# Patient Record
Sex: Female | Born: 1990 | Race: White | Hispanic: No | Marital: Single | Smoking: Former smoker
Health system: Southern US, Community
[De-identification: ages and names within clinical notes are randomized; demographics above are authoritative.]

## PROBLEM LIST (undated history)

## (undated) DIAGNOSIS — K8020 Calculus of gallbladder without cholecystitis without obstruction: Secondary | ICD-10-CM

## (undated) DIAGNOSIS — O24419 Gestational diabetes mellitus in pregnancy, unspecified control: Secondary | ICD-10-CM

## (undated) HISTORY — DX: Gestational diabetes mellitus in pregnancy, unspecified control: O24.419

## (undated) HISTORY — PX: OTHER SURGICAL HISTORY: SHX169

---
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
## 2008-01-14 ENCOUNTER — Emergency Department: Payer: Self-pay | Admitting: Emergency Medicine

## 2008-06-02 ENCOUNTER — Emergency Department: Payer: Self-pay | Admitting: Emergency Medicine

## 2009-05-15 ENCOUNTER — Emergency Department: Payer: Self-pay | Admitting: Emergency Medicine

## 2011-12-03 ENCOUNTER — Emergency Department: Payer: Self-pay | Admitting: Emergency Medicine

## 2013-04-04 ENCOUNTER — Emergency Department: Payer: Self-pay | Admitting: Emergency Medicine

## 2013-04-19 ENCOUNTER — Emergency Department: Payer: Self-pay | Admitting: Emergency Medicine

## 2013-04-19 LAB — URINALYSIS, COMPLETE
Nitrite: NEGATIVE
Ph: 5 (ref 4.5–8.0)
RBC,UR: 606 /HPF (ref 0–5)
Specific Gravity: 1.032 (ref 1.003–1.030)
WBC UR: 4 /HPF (ref 0–5)

## 2013-04-20 LAB — CBC
HGB: 14.5 g/dL (ref 12.0–16.0)
MCHC: 34.2 g/dL (ref 32.0–36.0)
RBC: 4.9 10*6/uL (ref 3.80–5.20)
RDW: 14.5 % (ref 11.5–14.5)

## 2013-05-02 ENCOUNTER — Emergency Department: Payer: Self-pay | Admitting: Internal Medicine

## 2013-05-02 LAB — URINALYSIS, COMPLETE
Bacteria: NONE SEEN
Bilirubin,UR: NEGATIVE
Leukocyte Esterase: NEGATIVE
Specific Gravity: 1.023 (ref 1.003–1.030)
WBC UR: 3 /HPF (ref 0–5)

## 2013-05-02 LAB — HCG, QUANTITATIVE, PREGNANCY: Beta Hcg, Quant.: 250 m[IU]/mL — ABNORMAL HIGH

## 2013-05-02 LAB — CBC
MCHC: 33.3 g/dL (ref 32.0–36.0)
MCV: 87 fL (ref 80–100)
Platelet: 238 10*3/uL (ref 150–440)
WBC: 9.8 10*3/uL (ref 3.6–11.0)

## 2013-05-14 ENCOUNTER — Ambulatory Visit: Payer: Self-pay | Admitting: Physician Assistant

## 2013-12-25 ENCOUNTER — Emergency Department: Payer: Self-pay | Admitting: Emergency Medicine

## 2013-12-26 LAB — BETA STREP CULTURE(ARMC)

## 2014-04-27 ENCOUNTER — Emergency Department: Payer: Self-pay | Admitting: Emergency Medicine

## 2014-04-27 LAB — COMPREHENSIVE METABOLIC PANEL
AST: 16 U/L (ref 15–37)
Albumin: 3.6 g/dL (ref 3.4–5.0)
Alkaline Phosphatase: 73 U/L
Anion Gap: 9 (ref 7–16)
BILIRUBIN TOTAL: 0.6 mg/dL (ref 0.2–1.0)
BUN: 6 mg/dL — ABNORMAL LOW (ref 7–18)
CREATININE: 0.65 mg/dL (ref 0.60–1.30)
Calcium, Total: 8.7 mg/dL (ref 8.5–10.1)
Chloride: 103 mmol/L (ref 98–107)
Co2: 24 mmol/L (ref 21–32)
EGFR (African American): >60
EGFR (Non-African Amer.): >60
Glucose: 92 mg/dL (ref 65–99)
OSMOLALITY: 269 (ref 275–301)
Potassium: 3.9 mmol/L (ref 3.5–5.1)
SGPT (ALT): 32 U/L (ref 12–78)
Sodium: 136 mmol/L (ref 136–145)
TOTAL PROTEIN: 7.1 g/dL (ref 6.4–8.2)

## 2014-04-27 LAB — CBC WITH DIFFERENTIAL/PLATELET
Basophil #: 0 10*3/uL (ref 0.0–0.1)
Basophil %: 0.2 %
EOS PCT: 0.3 %
Eosinophil #: 0 10*3/uL (ref 0.0–0.7)
HCT: 43.3 % (ref 35.0–47.0)
HGB: 14.6 g/dL (ref 12.0–16.0)
LYMPHS ABS: 1.5 10*3/uL (ref 1.0–3.6)
Lymphocyte %: 16.5 %
MCH: 30.4 pg (ref 26.0–34.0)
MCHC: 33.7 g/dL (ref 32.0–36.0)
MCV: 90 fL (ref 80–100)
Monocyte #: 0.5 x10 3/mm (ref 0.2–0.9)
Monocyte %: 5.3 %
Neutrophil #: 7 10*3/uL — ABNORMAL HIGH (ref 1.4–6.5)
Neutrophil %: 77.7 %
PLATELETS: 212 10*3/uL (ref 150–440)
RBC: 4.79 10*6/uL (ref 3.80–5.20)
RDW: 12.9 % (ref 11.5–14.5)
WBC: 9 10*3/uL (ref 3.6–11.0)

## 2014-04-27 LAB — URINALYSIS, COMPLETE
BILIRUBIN, UR: NEGATIVE
Bacteria: NONE SEEN
Blood: NEGATIVE
Glucose,UR: NEGATIVE mg/dL (ref 0–75)
LEUKOCYTE ESTERASE: NEGATIVE
Nitrite: NEGATIVE
PH: 7 (ref 4.5–8.0)
PROTEIN: NEGATIVE
RBC,UR: 1 /HPF (ref 0–5)
SPECIFIC GRAVITY: 1.02 (ref 1.003–1.030)
Squamous Epithelial: 12

## 2014-05-19 ENCOUNTER — Encounter: Payer: Self-pay | Admitting: Obstetrics & Gynecology

## 2014-06-06 ENCOUNTER — Encounter: Payer: Self-pay | Admitting: Obstetrics & Gynecology

## 2014-07-14 ENCOUNTER — Encounter: Payer: Self-pay | Admitting: Obstetrics & Gynecology

## 2014-07-21 ENCOUNTER — Emergency Department: Payer: Self-pay | Admitting: Emergency Medicine

## 2014-07-22 LAB — CBC WITH DIFFERENTIAL/PLATELET
BASOS ABS: 0 10*3/uL (ref 0.0–0.1)
BASOS PCT: 0.4 %
EOS ABS: 0.1 10*3/uL (ref 0.0–0.7)
EOS PCT: 1 %
HCT: 36.1 % (ref 35.0–47.0)
HGB: 11.8 g/dL — ABNORMAL LOW (ref 12.0–16.0)
LYMPHS PCT: 19.2 %
Lymphocyte #: 2.1 10*3/uL (ref 1.0–3.6)
MCH: 29 pg (ref 26.0–34.0)
MCHC: 32.6 g/dL (ref 32.0–36.0)
MCV: 89 fL (ref 80–100)
Monocyte #: 0.7 x10 3/mm (ref 0.2–0.9)
Monocyte %: 6 %
NEUTROS ABS: 8.1 10*3/uL — AB (ref 1.4–6.5)
Neutrophil %: 73.4 %
PLATELETS: 188 10*3/uL (ref 150–440)
RBC: 4.07 10*6/uL (ref 3.80–5.20)
RDW: 12.8 % (ref 11.5–14.5)
WBC: 11 10*3/uL (ref 3.6–11.0)

## 2014-07-22 LAB — COMPREHENSIVE METABOLIC PANEL
ALT: 35 U/L
Albumin: 2.8 g/dL — ABNORMAL LOW (ref 3.4–5.0)
Alkaline Phosphatase: 66 U/L
Anion Gap: 7 (ref 7–16)
BILIRUBIN TOTAL: 0.3 mg/dL (ref 0.2–1.0)
BUN: 11 mg/dL (ref 7–18)
CALCIUM: 8.3 mg/dL — AB (ref 8.5–10.1)
Chloride: 106 mmol/L (ref 98–107)
Co2: 23 mmol/L (ref 21–32)
Creatinine: 0.54 mg/dL — ABNORMAL LOW (ref 0.60–1.30)
EGFR (African American): >60
EGFR (Non-African Amer.): >60
Glucose: 102 mg/dL — ABNORMAL HIGH (ref 65–99)
Osmolality: 272 (ref 275–301)
Potassium: 3.9 mmol/L (ref 3.5–5.1)
SGOT(AST): 28 U/L (ref 15–37)
Sodium: 136 mmol/L (ref 136–145)
Total Protein: 6.4 g/dL (ref 6.4–8.2)

## 2014-08-22 ENCOUNTER — Encounter: Payer: Self-pay | Admitting: Obstetrics & Gynecology

## 2014-10-10 ENCOUNTER — Encounter: Payer: Self-pay | Admitting: Maternal & Fetal Medicine

## 2014-11-14 ENCOUNTER — Encounter: Payer: Self-pay | Admitting: Obstetrics and Gynecology

## 2014-11-21 ENCOUNTER — Encounter: Payer: Self-pay | Admitting: Obstetrics and Gynecology

## 2014-11-28 ENCOUNTER — Observation Stay: Payer: Self-pay

## 2014-12-01 ENCOUNTER — Encounter: Payer: Self-pay | Admitting: Maternal & Fetal Medicine

## 2014-12-06 DIAGNOSIS — O24419 Gestational diabetes mellitus in pregnancy, unspecified control: Secondary | ICD-10-CM | POA: Insufficient documentation

## 2014-12-12 DIAGNOSIS — A568 Sexually transmitted chlamydial infection of other sites: Secondary | ICD-10-CM | POA: Insufficient documentation

## 2014-12-12 DIAGNOSIS — B009 Herpesviral infection, unspecified: Secondary | ICD-10-CM | POA: Insufficient documentation

## 2014-12-12 DIAGNOSIS — O99330 Smoking (tobacco) complicating pregnancy, unspecified trimester: Secondary | ICD-10-CM | POA: Insufficient documentation

## 2014-12-12 DIAGNOSIS — O98519 Other viral diseases complicating pregnancy, unspecified trimester: Secondary | ICD-10-CM

## 2014-12-12 DIAGNOSIS — O98319 Other infections with a predominantly sexual mode of transmission complicating pregnancy, unspecified trimester: Secondary | ICD-10-CM

## 2014-12-12 DIAGNOSIS — O0990 Supervision of high risk pregnancy, unspecified, unspecified trimester: Secondary | ICD-10-CM | POA: Insufficient documentation

## 2015-02-28 NOTE — H&P (Signed)
L&D Evaluation:  History:  HPI 24 year old g2 P0010 with EDC=12/18/2014 by a 9wk4d ultrasound presents with N/V/D x 2 days. Last kept down food 2/6 PM. Was able to keep down fluids yesterday. Has vomited 6x today and once there was blood in vomitus. Has had diarrhea 4x today, but no blood in stool. Has not had antibiotics in the last 6 mos. Has had some lower abdominal cramping since last week and the cramping and uterine tightening has worsened today. PNC begun at [facility], but she received no PNC from 11-35 weeks except for an [facility] at 17wk4 days. Her PNC has also been remarkable for untreated GDM diagnosed at 36 weeks, hx of HSV (started Acyclovir ppx recently), Obesity (BMI44 at start of pregnancy), and positive GBS culture. LABS: O POS/RI (vaccine x2), VI   Presents with nausea/vomiting, diarrhea and cramping   Patient's Medical History Obesity, GDM (untreated), HSV   Medications Pre Natal Vitamins  Acyclovir 400 mgm BID, TUMS   Allergies NKDA   Social History tobacco   Family History Non-Contributory   ROS:  ROS see HPI   Exam:  Vital Signs 125/93, 131/86. 98.7 Pulse 101-127    General no apparent distress   Mental Status clear   Chest clear   Heart normal sinus rhythm, no murmur/gallop/rubs   Abdomen gravid/ tender LUS/ BS active   Fetal Position cephalic   Back no CVAT   Edema 1+   Pelvic no external lesions, 3/80%/-2   Mebranes Intact   FHT normal rate with no decels, 145-150 with accels to 170s-180s   FHT Description mod variability   Ucx q1-3 min apart   Impression:  Impression IUP at 37 1/7 weeks with N/V/D. R/O labor   Plan:  Plan EFM/NST, monitor contractions and for cervical change, IV hydration and IV Zofran. UA. UDS. CBC, BMP.  NPO until 9PM. Then clear liquids if no further vomiting.   Electronic Signatures: Trinna BalloonGutierrez, Cynai Skeens L (CNM)  (Signed 08-Feb-16 23:58)  Authored: L&D Evaluation   Last Updated: 08-Feb-16 23:58 by  Trinna BalloonGutierrez, Betzaira Mentel L (CNM)

## 2015-06-07 IMAGING — US US OB < 14 WEEKS - US OB TV
1 series · 13 of 28 positions shown · non-contrast
Comparison: none

REASON FOR EXAM: viability dating
COMMENTS:

[Series 1: us ob < 14 weeks - us ob tv · 0.26mm/px · 87 acquisitions, 13 frames shown]
[im 4/87]
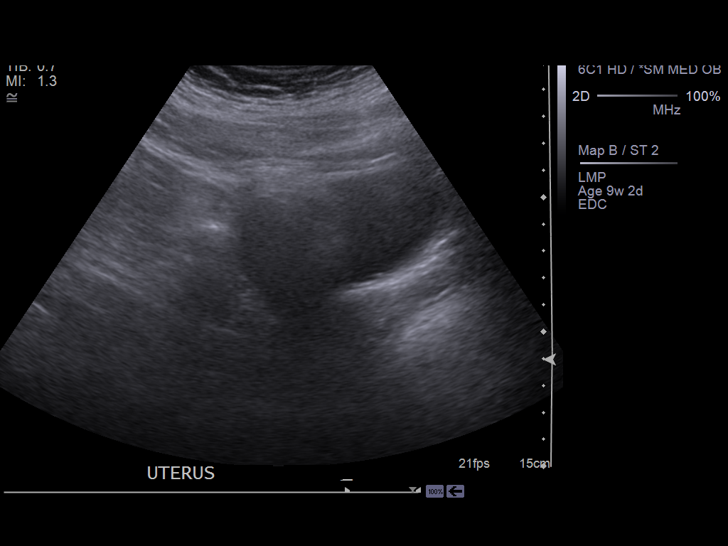
[im 10/87]
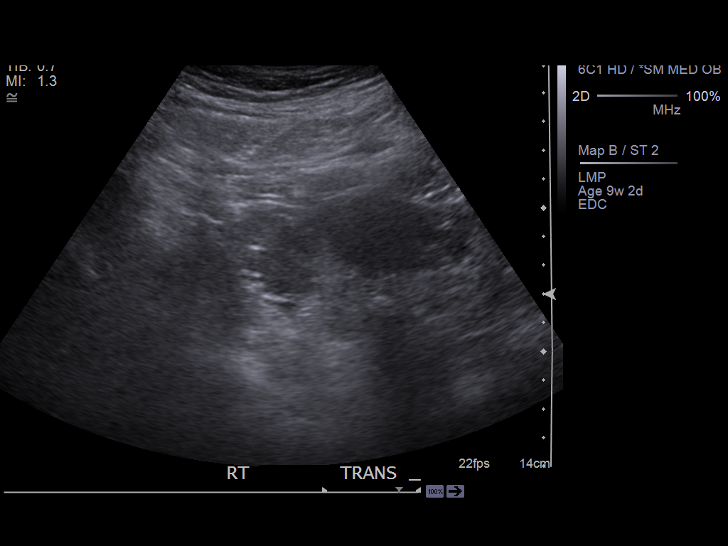
[im 16/87]
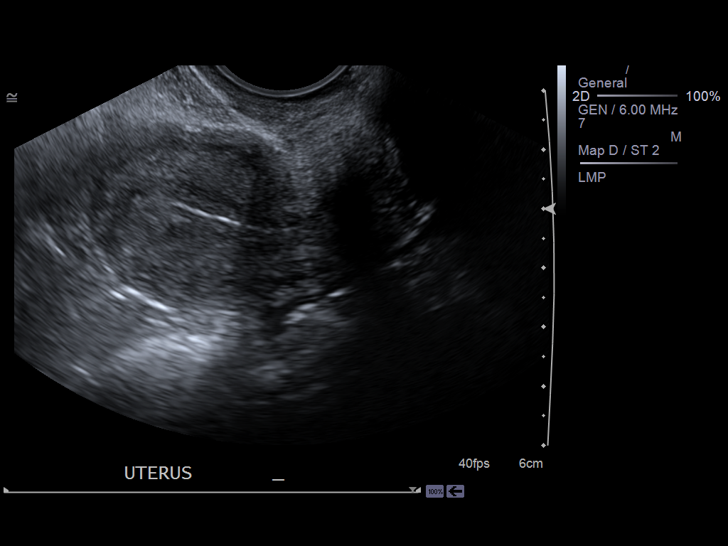
[im 23/87]
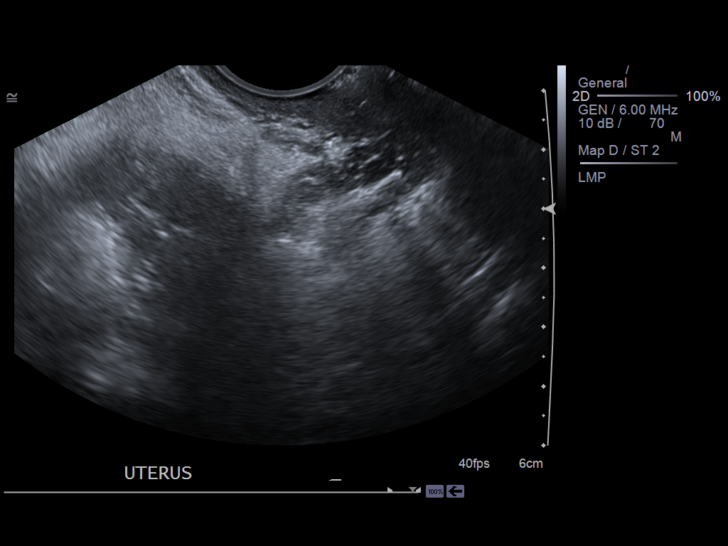
[im 29/87]
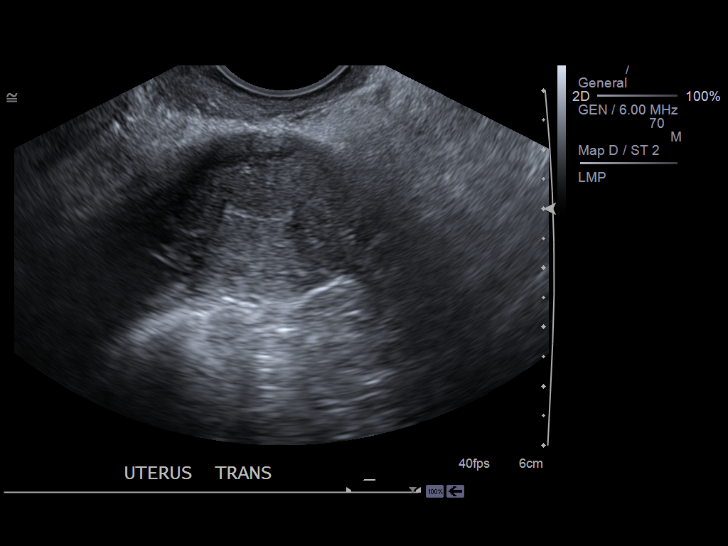
[im 36/87]
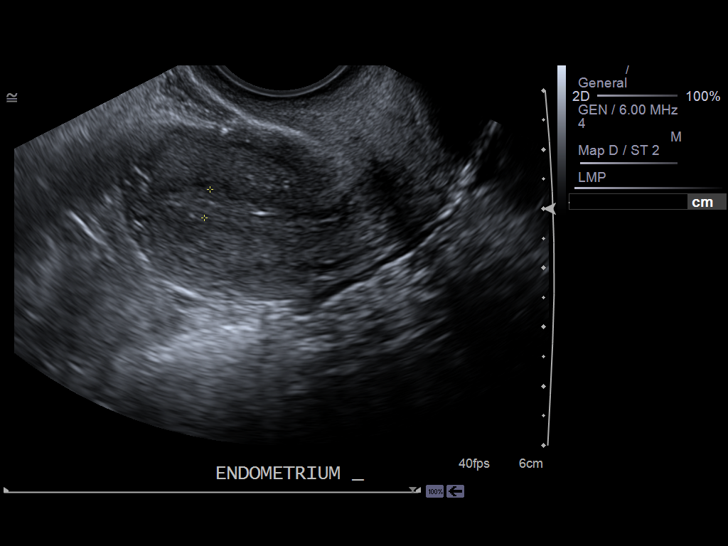
[im 45/87]
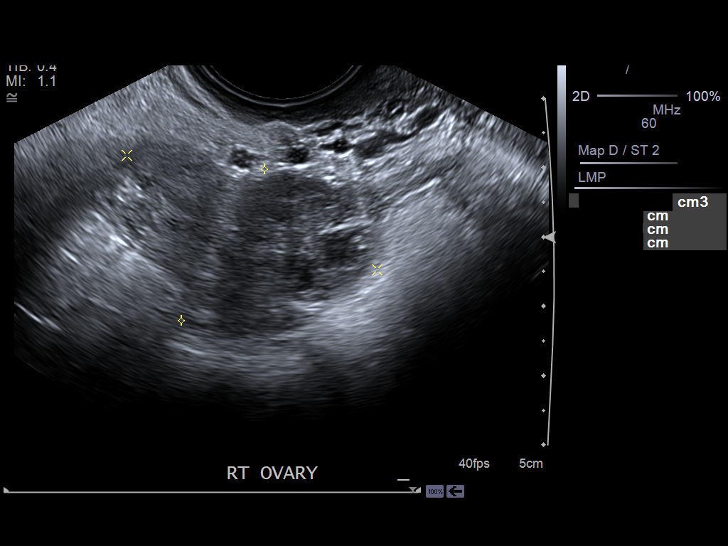
[im 51/87]
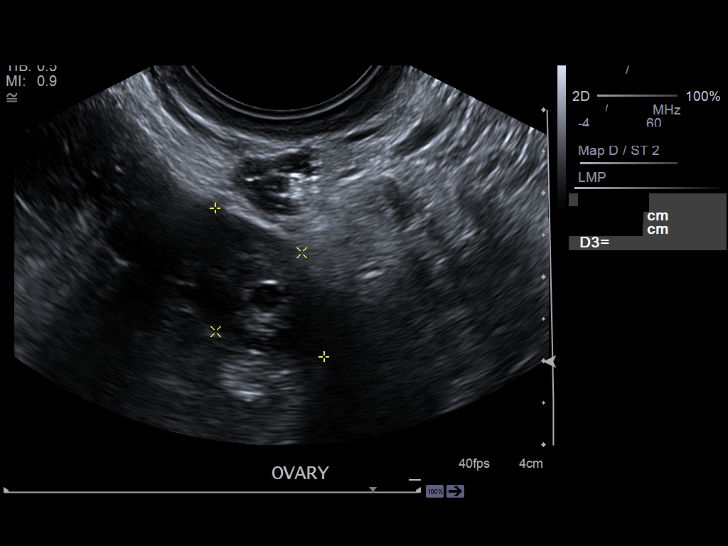
[im 58/87]
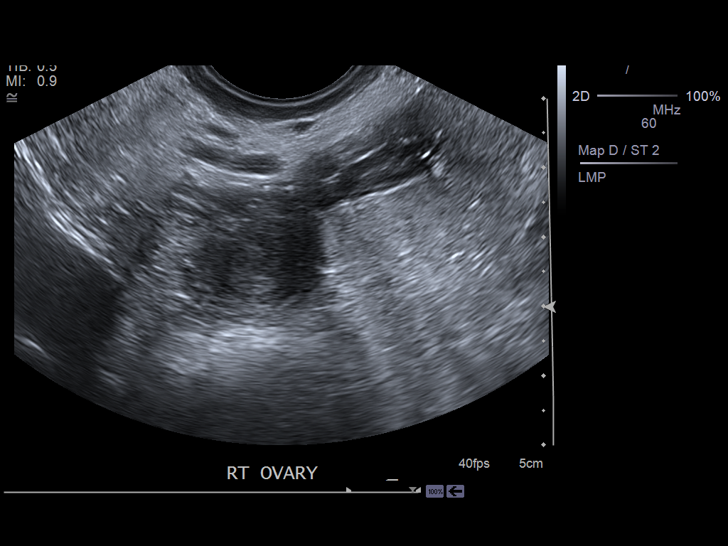
[im 64/87]
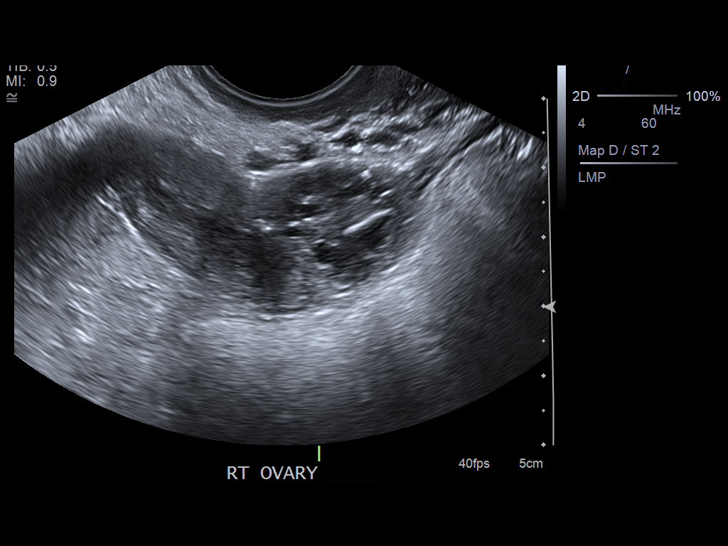
[im 71/87]
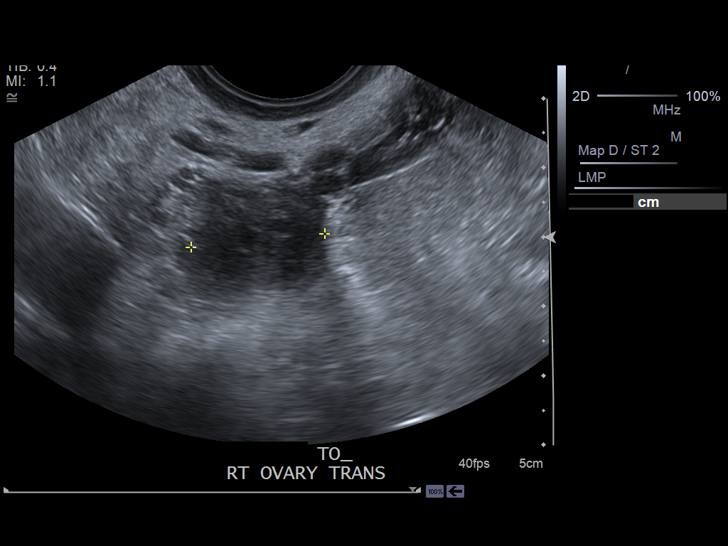
[im 77/87]
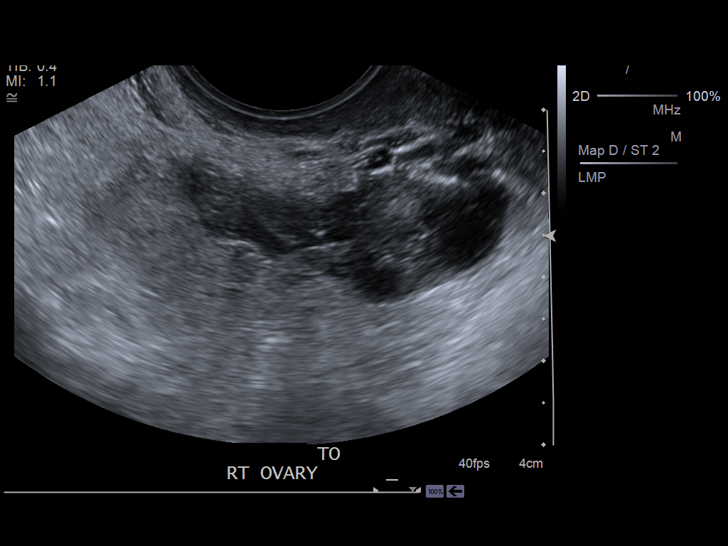
[im 83/87]
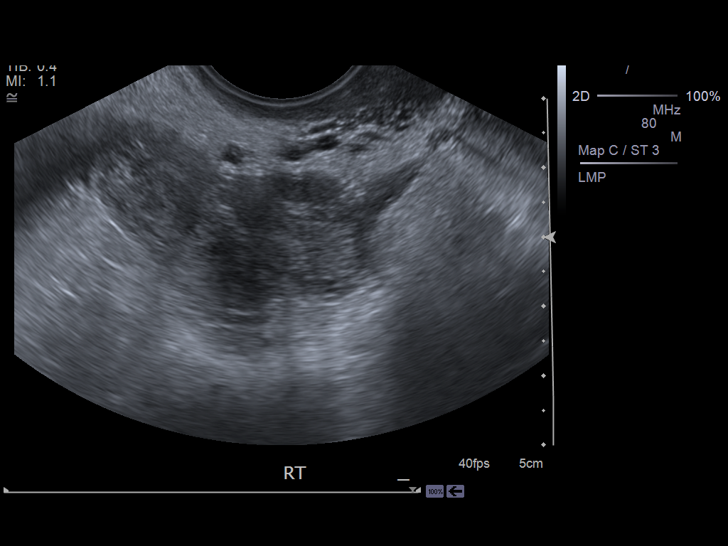

[13 of 28 positions shown; findings below may reference images not displayed]

PROCEDURE:     ADARSH - ADARSH OB LESS THAN 14 WEEKS W/TV  - May 14, 2013 [DATE]

RESULT:     Transabdominal and transvaginal imaging techniques were employed
to evaluate the pelvis. The patient's current beta-hCG values are not
available but there has been a downward trend over the preceding month.

The uterus appears empty. It demonstrates a smooth contour and measures 6 x
3 x 4 cm. The endometrial stripe measures 5 mm in thickness. No endometrial
fluid collections are demonstrated.

The left ovary measures 2.2 x 1.4 x 1.4 centimeters. It demonstrates a
developing a follicle. Its vascularity is normal.

The right ovary measures 4 x 2.5 x 1.8 cm. It demonstrates numerous
developing follicles and normal vascularity. Immediately adjacent to or
arising from the right ovary there is a 2.3 x 1.7 x 1.9 cm diameter
uniformly hypoechoic mass demonstrating some distal shadowing. There is a
trace of free fluid in the adnexal regions.
IMPRESSION: 1. The uterus is empty with no endometrial stripe thickening.
2. The left ovary is normal in appearance.
3. The right ovary itself appears normal but arising from at or immediately
adjacent to the right ovary there is a hypoechoic nonhypervascular mass that
is of uncertain significance. This could reflect a hemorrhagic cyst, a
nonviable ectopic, an endometrioma, or other entities such as tubo-ovarian
abscess in the appropriate clinical setting.

Correlation with the patient's clinical and laboratory values including a
current beta-hCG is needed. Followup pelvic CT scanning may be useful.

[REDACTED]

## 2015-09-22 ENCOUNTER — Encounter: Payer: Self-pay | Admitting: Emergency Medicine

## 2015-09-22 ENCOUNTER — Emergency Department
Admission: EM | Admit: 2015-09-22 | Discharge: 2015-09-23 | Disposition: A | Discharge status: Home / Self Care | Payer: Medicaid Other | Attending: Emergency Medicine | Admitting: Emergency Medicine

## 2015-09-22 DIAGNOSIS — O99211 Obesity complicating pregnancy, first trimester: Secondary | ICD-10-CM | POA: Insufficient documentation

## 2015-09-22 DIAGNOSIS — Z3A01 Less than 8 weeks gestation of pregnancy: Secondary | ICD-10-CM | POA: Diagnosis not present

## 2015-09-22 DIAGNOSIS — F17200 Nicotine dependence, unspecified, uncomplicated: Secondary | ICD-10-CM | POA: Insufficient documentation

## 2015-09-22 DIAGNOSIS — O99331 Smoking (tobacco) complicating pregnancy, first trimester: Secondary | ICD-10-CM | POA: Insufficient documentation

## 2015-09-22 DIAGNOSIS — O9989 Other specified diseases and conditions complicating pregnancy, childbirth and the puerperium: Secondary | ICD-10-CM | POA: Diagnosis present

## 2015-09-22 DIAGNOSIS — R110 Nausea: Secondary | ICD-10-CM | POA: Diagnosis not present

## 2015-09-22 DIAGNOSIS — R1011 Right upper quadrant pain: Secondary | ICD-10-CM | POA: Insufficient documentation

## 2015-09-22 DIAGNOSIS — R109 Unspecified abdominal pain: Secondary | ICD-10-CM

## 2015-09-22 DIAGNOSIS — O26851 Spotting complicating pregnancy, first trimester: Secondary | ICD-10-CM | POA: Diagnosis not present

## 2015-09-22 LAB — CBC
HCT: 39.2 % (ref 35.0–47.0)
HEMOGLOBIN: 13 g/dL (ref 12.0–16.0)
MCH: 27.5 pg (ref 26.0–34.0)
MCHC: 33.1 g/dL (ref 32.0–36.0)
MCV: 83 fL (ref 80.0–100.0)
PLATELETS: 247 10*3/uL (ref 150–440)
RBC: 4.73 MIL/uL (ref 3.80–5.20)
RDW: 14 % (ref 11.5–14.5)
WBC: 10.6 10*3/uL (ref 3.6–11.0)

## 2015-09-22 LAB — COMPREHENSIVE METABOLIC PANEL
ALBUMIN: 3.8 g/dL (ref 3.5–5.0)
ALK PHOS: 91 U/L (ref 38–126)
ALT: 20 U/L (ref 14–54)
ANION GAP: 6 (ref 5–15)
AST: 20 U/L (ref 15–41)
BILIRUBIN TOTAL: 0.1 mg/dL — AB (ref 0.3–1.2)
BUN: 14 mg/dL (ref 6–20)
CALCIUM: 9.1 mg/dL (ref 8.9–10.3)
CO2: 26 mmol/L (ref 22–32)
CREATININE: 0.7 mg/dL (ref 0.44–1.00)
Chloride: 106 mmol/L (ref 101–111)
GFR calc non Af Amer: >60 mL/min (ref >60)
GLUCOSE: 122 mg/dL — AB (ref 65–99)
Potassium: 3.9 mmol/L (ref 3.5–5.1)
SODIUM: 138 mmol/L (ref 135–145)
TOTAL PROTEIN: 7.3 g/dL (ref 6.5–8.1)

## 2015-09-22 LAB — URINALYSIS COMPLETE WITH MICROSCOPIC (ARMC ONLY)
BILIRUBIN URINE: NEGATIVE
Bacteria, UA: NONE SEEN
GLUCOSE, UA: NEGATIVE mg/dL
Leukocytes, UA: NEGATIVE
NITRITE: NEGATIVE
PROTEIN: 30 mg/dL — AB
SPECIFIC GRAVITY, URINE: 1.027 (ref 1.005–1.030)
pH: 6 (ref 5.0–8.0)

## 2015-09-22 LAB — WET PREP, GENITAL
CLUE CELLS WET PREP: NONE SEEN
SPERM: NONE SEEN
TRICH WET PREP: NONE SEEN
YEAST WET PREP: NONE SEEN

## 2015-09-22 LAB — POCT PREGNANCY, URINE: Preg Test, Ur: POSITIVE — AB

## 2015-09-22 LAB — ABO/RH: ABO/RH(D): O POS

## 2015-09-22 LAB — LIPASE, BLOOD: Lipase: 26 U/L (ref 11–51)

## 2015-09-22 LAB — HCG, QUANTITATIVE, PREGNANCY: hCG, Beta Chain, Quant, S: 883 m[IU]/mL — ABNORMAL HIGH (ref <5)

## 2015-09-22 MED ORDER — ONDANSETRON HCL 4 MG/2ML IJ SOLN
4.0000 mg | Freq: Once | INTRAMUSCULAR | Status: AC
  Administered 2015-09-22: 4 mg via INTRAVENOUS
  Filled 2015-09-22: qty 2

## 2015-09-22 MED ORDER — OXYCODONE-ACETAMINOPHEN 5-325 MG PO TABS
1.0000 | ORAL_TABLET | Freq: Once | ORAL | Status: AC
  Administered 2015-09-22: 1 via ORAL
  Filled 2015-09-22: qty 1

## 2015-09-22 MED ORDER — FENTANYL CITRATE (PF) 100 MCG/2ML IJ SOLN
50.0000 ug | Freq: Once | INTRAMUSCULAR | Status: AC
  Administered 2015-09-22: 50 ug via INTRAVENOUS
  Filled 2015-09-22: qty 2

## 2015-09-22 NOTE — ED Notes (Signed)
pt reports sharp RUQ pain that is intermittant but states today is worst ever

## 2015-09-22 NOTE — ED Provider Notes (Signed)
[facility] Emergency Department Provider Note  ____________________________________________  Time seen: Approximately 9:30 PM  I have reviewed the triage vital signs and the nursing notes.   HISTORY  Chief Complaint Abdominal Pain    HPI Crystal Strickland is a 24 y.o. female G3 P1 A1 who just found out she is pregnant with no reliable LMP, history of morbid obesity, presenting with right upper quadrant pain. Patient reports that during her last pregnancy she had some severe right upper quadrant pain that was never worked up. Over the past day she has been having a severe sharp right upper quadrant pain that radiates to the back associated with mild nausea but no vomiting. No fevers or chills. On arrival here, the patient had a pregnancy test is positive and states that she is currently "spotting." She states that she never has normal menstrual cycles. She denies any change in vaginal discharge, dysuria, diarrhea.   History reviewed. No pertinent past medical history.  There are no active problems to display for this patient.   History reviewed. No pertinent past surgical history.  No current outpatient prescriptions on file.  Allergies Review of patient's allergies indicates no known allergies.  History reviewed. No pertinent family history.  Social History Social History  Substance Use Topics  . Smoking status: Current Every Day Smoker  . Smokeless tobacco: None  . Alcohol Use: No    Review of Systems Constitutional: No fever/chills Eyes: No visual changes. ENT: No sore throat. Cardiovascular: Denies chest pain, palpitations. Respiratory: Denies shortness of breath.  No cough. Gastrointestinal: Positive right upper quadrant abdominal pain.  No nausea, no vomiting.  No diarrhea.  No constipation. Genitourinary: Negative for dysuria. No change in vaginal discharge. Positive vaginal spotting. Musculoskeletal: Negative for back pain. Skin:  Negative for rash. Neurological: Negative for headaches, focal weakness or numbness.  10-point ROS otherwise negative.  ____________________________________________   PHYSICAL EXAM:  VITAL SIGNS: ED Triage Vitals  Enc Vitals Group     BP 09/22/15 2028 124/85 mmHg     Pulse Rate 09/22/15 2028 80     Resp 09/22/15 2028 20     Temp 09/22/15 2028 97.7 F (36.5 C)     Temp Source 09/22/15 2028 Oral     SpO2 09/22/15 2028 99 %     Weight 09/22/15 2028 300 lb (136.079 kg)     Height 09/22/15 2028 5\' 8"  (1.727 m)     Head Cir --      Peak Flow --      Pain Score 09/22/15 2028 10     Pain Loc --      Pain Edu? --      Excl. in GC? --     Constitutional: Alert and oriented. Well appearing and in no acute distress. Answer question appropriately. Eyes: Conjunctivae are normal.  EOMI. Head: Atraumatic. Nose: No congestion/rhinnorhea. Mouth/Throat: Mucous membranes are moist.  Neck: No stridor.  Supple.   Cardiovascular: Normal rate, regular rhythm. No murmurs, rubs or gallops.  Respiratory: Normal respiratory effort.  No retractions. Lungs CTAB.  No wheezes, rales or ronchi. Gastrointestinal: Abdomen is obese, soft, nondistended. Patient has mild right upper quadrant tenderness to palpation without a Murphy's sign. No guarding or rebound. No peritoneal signs. Genitourinary: Normal-appearing external genitalia without lesions. Speculum exam reveals a mild amount of dark blood, no significant vaginal discharge, normal-appearing cervix. Bimanual exam reveals open external all, no CMT, no adnexal asses or tenderness, no suprapubic mass or tenderness. Musculoskeletal: No LE  edema.  Neurologic:  Normal speech and language. No gross focal neurologic deficits are appreciated.  Skin:  Skin is warm, dry and intact. No rash noted. Psychiatric: Mood and affect are normal. Speech and behavior are normal.  Normal judgement.  ____________________________________________   LABS (all labs ordered  are listed, but only abnormal results are displayed)  Labs Reviewed  COMPREHENSIVE METABOLIC PANEL - Abnormal; Notable for the following:    Glucose, Bld 122 (*)    Total Bilirubin 0.1 (*)    All other components within normal limits  URINALYSIS COMPLETEWITH MICROSCOPIC ([facility] ONLY) - Abnormal; Notable for the following:    Color, Urine YELLOW (*)    APPearance HAZY (*)    Ketones, ur TRACE (*)    Hgb urine dipstick 3+ (*)    Protein, ur 30 (*)    Squamous Epithelial / LPF 6-30 (*)    All other components within normal limits  POCT PREGNANCY, URINE - Abnormal; Notable for the following:    Preg Test, Ur POSITIVE (*)    All other components within normal limits  WET PREP, GENITAL  CHLAMYDIA/NGC RT PCR ([facility] ONLY)  LIPASE, BLOOD  CBC  HCG, QUANTITATIVE, PREGNANCY  POC URINE PREG, ED  ABO/RH   ____________________________________________  EKG  Not indicated ____________________________________________  RADIOLOGY  No results found.  ____________________________________________   PROCEDURES  Procedure(s) performed: None  Critical Care performed: No ____________________________________________   INITIAL IMPRESSION / ASSESSMENT AND PLAN / ED COURSE  Pertinent labs & imaging results that were available during my care of the patient were reviewed by me and considered in my medical decision making (see chart for details).  24 y.o. female G3 P1 A1 with new pregnancy diagnosis presenting with right upper quadrant pain. Given that the patient is having vaginal spotting and does not how far along she is in the pregnancy, I will evaluate her for her pregnancy and rule out ectopic pregnancy. Her right upper quadrant pain is possibly due to gallbladder disease and we will get an ultrasound to evaluate her for that as well.  ----------------------------------------- 10:05 PM on 09/22/2015 -----------------------------------------  The patient's right upper quadrant  pain has completely resolved. Her pelvic exam has been performed and I'm waiting for her right upper quadrant and pelvic ultrasounds. Her labs are still pending.  ----------------------------------------- 10:55 PM on 09/22/2015 -----------------------------------------  I sign the patient out to Dr. Huel Cote who will follow her Rh type, the remainder of her labs, and her right upper quadrant and pelvic ultrasounds. The patient is in stable condition and her pain is significantly improved. ____________________________________________  FINAL CLINICAL IMPRESSION(S) / ED DIAGNOSES  Final diagnoses:  Right upper quadrant pain      NEW MEDICATIONS STARTED DURING THIS VISIT:  New Prescriptions   No medications on file     Rockne Menghini, MD 09/22/15 2255

## 2015-09-23 ENCOUNTER — Emergency Department: Payer: Medicaid Other

## 2015-09-23 LAB — CHLAMYDIA/NGC RT PCR (ARMC ONLY)
CHLAMYDIA TR: NOT DETECTED
N gonorrhoeae: NOT DETECTED

## 2015-09-23 NOTE — ED Provider Notes (Signed)
-----------------------------------------   2:29 AM on 09/23/2015 -----------------------------------------   Blood pressure 124/86, pulse 82, temperature 97.7 F (36.5 C), temperature source Oral, resp. rate 19, height 5\' 8"  (1.727 m), weight 300 lb (136.079 kg), last menstrual period 08/23/2015, SpO2 99 %.  Assuming care from Dr. Ezzard StandingNewman.  In short, Crystal Strickland is a 24 y.o. female with a chief complaint of Abdominal Pain .  Refer to the original H&P for additional details.   EXAM: US ABDOMEN LIMITED - RIGHT UPPER QUADRANT  COMPARISON: None.  FINDINGS: Gallbladder:  The gallbladder is contracted around multiple calculi. No gallbladder wall thickening or pericholecystic fluid. The patient was not tender over the gallbladder.  Common bile duct:  Diameter: 4.9 mm.  Liver:  No focal lesion identified. Within normal limits in parenchymal echogenicity.  IMPRESSION: Cholelithiasis without sonographic evidence of cholecystitis. Unremarkable liver and bile ducts. Incidentally noted upper pole right renal cyst measuring 3 cm.   Electronically Signed By: Ellery Plunkaniel R Mitchell M.D. On: 09/23/2015 02:05          US OB Comp Less 14 Wks (Final result) Result time: 09/23/15 01:36:51   Final result by Rad Results In Interface (09/23/15 01:36:51)   Narrative:   CLINICAL DATA: Lower abdominal pain and spotting.  EXAM: OBSTETRIC <14 WK US AND TRANSVAGINAL OB US  TECHNIQUE: Both transabdominal and transvaginal ultrasound examinations were performed for complete evaluation of the gestation as well as the maternal uterus, adnexal regions, and pelvic cul-de-sac. Transvaginal technique was performed to assess early pregnancy.  COMPARISON: None.  FINDINGS: Intrauterine gestational sac: None  Yolk sac: No  Embryo: No  Cardiac Activity: No  Heart Rate:  bpm  MSD:  mm  w   d  CRL:  mm  w  d         US EDC:  Maternal uterus/adnexae:  Normal ovaries. No abnormal fluid collections. Endometrium measures 11 mm. Small volume blood or fluid in the endocervical canal.  IMPRESSION: No visible gestational sac. This may represent an intrauterine gestation which is too small to visualize, but cannot exclude an early failed pregnancy or an ectopic gestation.       The current plan of care is to patient's blood type is O+ and her serum quantitative test is 883. The patient does not have any abdominal pain at this time and her ultrasound shows what appears to be some gallstones without evidence of cholecystitis. There is no evidence on her ultrasound for current ectopic pregnancy though with low serum quantitative study she'll need serial studies and a repeat ultrasound. Patient states that she does have an OB/GYN that she would like to follow up with and she's been referred to OB/GYN unassigned  Jennye MoccasinBrian S Quigley, MD 09/23/15 0230

## 2015-09-23 NOTE — Discharge Instructions (Signed)
Biliary Colic Biliary colic is a pain in the upper abdomen. The pain:  Is usually felt on the right side of the abdomen, but it may also be felt in the center of the abdomen, just below the breastbone (sternum).  May spread back toward the right shoulder blade.  May be steady or irregular.  May be accompanied by nausea and vomiting. Most of the time, the pain goes away in 1-5 hours. After the most intense pain passes, the abdomen may continue to ache mildly for about 24 hours. Biliary colic is caused by a blockage in the bile duct. The bile duct is a pathway that carries bile--a liquid that helps to digest fats--from the gallbladder to the small intestine. Biliary colic usually occurs after eating, when the digestive system demands bile. The pain develops when muscle cells contract forcefully to try to move the blockage so that bile can get by. HOME CARE INSTRUCTIONS  Take medicines only as directed by your health care provider.  Drink enough fluid to keep your urine clear or pale yellow.  Avoid fatty, greasy, and fried foods. These kinds of foods increase your body's demand for bile.  Avoid any foods that make your pain worse.  Avoid overeating.  Avoid having a large meal after fasting. SEEK MEDICAL CARE IF:  You develop a fever.  Your pain gets worse.  You vomit.  You develop nausea that prevents you from eating and drinking. SEEK IMMEDIATE MEDICAL CARE IF:  You suddenly develop a fever and shaking chills.  You develop a yellowish discoloration (jaundice) of:  Skin.  Whites of the eyes.  Mucous membranes.  You have continuous or severe pain that is not relieved with medicines.  You have nausea and vomiting that is not relieved with medicines.  You develop dizziness or you faint.   This information is not intended to replace advice given to you by your health care provider. Make sure you discuss any questions you have with your health care provider.   Document  Released: 03/10/2006 Document Revised: 02/21/2015 Document Reviewed: 07/19/2014 Elsevier Interactive Patient Education [employer]2016 [employer]  Please return immediately if condition worsens. Please contact her primary physician or the physician you were given for referral. If you have any specialist physicians involved in her treatment and plan please also contact them. Thank you for using [facility] [facility]. Please follow-up with OB/GYN for serial serum quantitative pregnancy test. Your current test is at 883.

## 2015-09-23 NOTE — ED Notes (Signed)
Patient with no complaints at this time. Respirations even and unlabored. Skin warm/dry. Discharge instructions reviewed with patient at this time. Patient given opportunity to voice concerns/ask questions. IV removed per policy and band-aid applied to site. Patient discharged at this time and left Emergency Department with steady gait.  

## 2015-09-23 NOTE — ED Notes (Signed)
Patient transported to Ultrasound 

## 2015-09-23 NOTE — ED Notes (Signed)
Patient back from US.

## 2015-09-23 NOTE — ED Notes (Signed)
MD at bedside. 

## 2015-09-24 ENCOUNTER — Encounter: Payer: Self-pay | Admitting: Emergency Medicine

## 2015-09-24 ENCOUNTER — Emergency Department
Admission: EM | Admit: 2015-09-24 | Discharge: 2015-09-24 | Disposition: A | Discharge status: Home / Self Care | Payer: Medicaid Other | Attending: Student | Admitting: Student

## 2015-09-24 ENCOUNTER — Emergency Department: Payer: Medicaid Other

## 2015-09-24 DIAGNOSIS — O99331 Smoking (tobacco) complicating pregnancy, first trimester: Secondary | ICD-10-CM | POA: Diagnosis not present

## 2015-09-24 DIAGNOSIS — O99211 Obesity complicating pregnancy, first trimester: Secondary | ICD-10-CM | POA: Insufficient documentation

## 2015-09-24 DIAGNOSIS — R1011 Right upper quadrant pain: Secondary | ICD-10-CM

## 2015-09-24 DIAGNOSIS — R52 Pain, unspecified: Secondary | ICD-10-CM

## 2015-09-24 DIAGNOSIS — F17200 Nicotine dependence, unspecified, uncomplicated: Secondary | ICD-10-CM | POA: Diagnosis not present

## 2015-09-24 DIAGNOSIS — O210 Mild hyperemesis gravidarum: Secondary | ICD-10-CM | POA: Insufficient documentation

## 2015-09-24 DIAGNOSIS — Z3A01 Less than 8 weeks gestation of pregnancy: Secondary | ICD-10-CM | POA: Insufficient documentation

## 2015-09-24 DIAGNOSIS — O99611 Diseases of the digestive system complicating pregnancy, first trimester: Secondary | ICD-10-CM | POA: Diagnosis not present

## 2015-09-24 DIAGNOSIS — K8020 Calculus of gallbladder without cholecystitis without obstruction: Secondary | ICD-10-CM | POA: Insufficient documentation

## 2015-09-24 DIAGNOSIS — E669 Obesity, unspecified: Secondary | ICD-10-CM | POA: Insufficient documentation

## 2015-09-24 HISTORY — DX: Calculus of gallbladder without cholecystitis without obstruction: K80.20

## 2015-09-24 LAB — CBC
HCT: 40 % (ref 35.0–47.0)
Hemoglobin: 13.1 g/dL (ref 12.0–16.0)
MCH: 27.1 pg (ref 26.0–34.0)
MCHC: 32.7 g/dL (ref 32.0–36.0)
MCV: 83 fL (ref 80.0–100.0)
Platelets: 232 10*3/uL (ref 150–440)
RBC: 4.82 MIL/uL (ref 3.80–5.20)
RDW: 14 % (ref 11.5–14.5)
WBC: 10.7 10*3/uL (ref 3.6–11.0)

## 2015-09-24 LAB — URINALYSIS COMPLETE WITH MICROSCOPIC (ARMC ONLY)
Bacteria, UA: NONE SEEN
Bilirubin Urine: NEGATIVE
Glucose, UA: NEGATIVE mg/dL
KETONES UR: NEGATIVE mg/dL
Leukocytes, UA: NEGATIVE
NITRITE: NEGATIVE
PH: 5 (ref 5.0–8.0)
PROTEIN: 30 mg/dL — AB
SPECIFIC GRAVITY, URINE: 1.029 (ref 1.005–1.030)

## 2015-09-24 LAB — COMPREHENSIVE METABOLIC PANEL
ALK PHOS: 95 U/L (ref 38–126)
ALT: 32 U/L (ref 14–54)
ANION GAP: 5 (ref 5–15)
AST: 52 U/L — ABNORMAL HIGH (ref 15–41)
Albumin: 3.9 g/dL (ref 3.5–5.0)
BUN: 13 mg/dL (ref 6–20)
CALCIUM: 8.9 mg/dL (ref 8.9–10.3)
CO2: 25 mmol/L (ref 22–32)
Chloride: 107 mmol/L (ref 101–111)
Creatinine, Ser: 0.77 mg/dL (ref 0.44–1.00)
GFR calc non Af Amer: >60 mL/min (ref >60)
Glucose, Bld: 109 mg/dL — ABNORMAL HIGH (ref 65–99)
Potassium: 4.6 mmol/L (ref 3.5–5.1)
SODIUM: 137 mmol/L (ref 135–145)
TOTAL PROTEIN: 7.4 g/dL (ref 6.5–8.1)

## 2015-09-24 LAB — LIPASE, BLOOD: Lipase: 25 U/L (ref 11–51)

## 2015-09-24 LAB — HCG, QUANTITATIVE, PREGNANCY: hCG, Beta Chain, Quant, S: 1001 m[IU]/mL — ABNORMAL HIGH (ref <5)

## 2015-09-24 MED ORDER — FENTANYL CITRATE (PF) 100 MCG/2ML IJ SOLN
50.0000 ug | Freq: Once | INTRAMUSCULAR | Status: AC
  Administered 2015-09-24: 50 ug via INTRAVENOUS
  Filled 2015-09-24: qty 2

## 2015-09-24 MED ORDER — ACETAMINOPHEN 500 MG PO TABS: 500.0000 mg | ORAL_TABLET | Freq: Four times a day (QID) | ORAL | Status: AC | PRN

## 2015-09-24 MED ORDER — ONDANSETRON HCL 4 MG/2ML IJ SOLN
4.0000 mg | Freq: Once | INTRAMUSCULAR | Status: AC
  Administered 2015-09-24: 4 mg via INTRAVENOUS
  Filled 2015-09-24: qty 2

## 2015-09-24 MED ORDER — ONDANSETRON HCL 4 MG/2ML IJ SOLN
4.0000 mg | Freq: Once | INTRAMUSCULAR | Status: AC | PRN
  Administered 2015-09-24: 4 mg via INTRAVENOUS
  Filled 2015-09-24: qty 2

## 2015-09-24 MED ORDER — MORPHINE SULFATE (PF) 4 MG/ML IV SOLN
4.0000 mg | Freq: Once | INTRAVENOUS | Status: AC
  Administered 2015-09-24: 4 mg via INTRAVENOUS
  Filled 2015-09-24: qty 1

## 2015-09-24 MED ORDER — OXYCODONE HCL 5 MG PO TABS: 5.0000 mg | ORAL_TABLET | Freq: Four times a day (QID) | ORAL | Status: AC | PRN

## 2015-09-24 MED ORDER — SODIUM CHLORIDE 0.9 % IV BOLUS (SEPSIS)
1000.0000 mL | Freq: Once | INTRAVENOUS | Status: AC
  Administered 2015-09-24: 1000 mL via INTRAVENOUS

## 2015-09-24 NOTE — Discharge Instructions (Signed)
Follow-up with your OB/GYN doctor in the next 48 hours for recheck of your pregnancy hormone level. Follow-up with general surgery as soon as possible regarding your gallstones. Return to the emergency department if you develop severe or worsening pain, heavy vaginal bleeding, chest pain, lightheadedness, difficulty breathing, blood in vomit or stools, fevers, or for any other concerns.

## 2015-09-24 NOTE — ED Provider Notes (Signed)
[facility] Emergency Department Provider Note  ____________________________________________  Time seen: Approximately 2:11 AM  I have reviewed the triage vital signs and the nursing notes.   HISTORY  Chief Complaint Abdominal Pain    HPI Crystal Strickland is a 24 y.o. female  553P1, unknown gestational age but LMP ~ 1 month ago with history of gallstones who presents for evaluation of one week continued right upper quadrant pain, gradual onset, intermittent since onset, currently severe and worse with eating/drinking. Patient was evaluated in this emergency department on 09/22/2015 for similar complaints, she was found to have gallstones at that time, treated symptomatically and discharged home. She was also found to have O+ blood type and beta hCG of approximately 800 with ultrasound that was indeterminate and was told to follow-up for repeat beta hCG testing. She reports continued pain. She has also had nausea and vomiting today. No fevers or diarrhea. No chest pain or difficulty breathing.   Past Medical History  Diagnosis Date  . Gallstones     There are no active problems to display for this patient.   History reviewed. No pertinent past surgical history.  No current outpatient prescriptions on file.  Allergies Review of patient's allergies indicates no known allergies.  History reviewed. No pertinent family history.  Social History Social History  Substance Use Topics  . Smoking status: Current Every Day Smoker  . Smokeless tobacco: None  . Alcohol Use: No    Review of Systems Constitutional: No fever/chills Eyes: No visual changes. ENT: No sore throat. Cardiovascular: Denies chest pain. Respiratory: Denies shortness of breath. Gastrointestinal: + abdominal pain.  + nausea, + vomiting.  No diarrhea.  No constipation. Genitourinary: Negative for dysuria. Musculoskeletal: Negative for back pain. Skin: Negative for rash. Neurological:  Negative for headaches, focal weakness or numbness.  10-point ROS otherwise negative.  ____________________________________________   PHYSICAL EXAM:   temperature 97.41F Pulse 80 bpm Respiratory rate 20 respirations per minute Blood pressure 124/85 mmHg O2 sat 99% on room air. VITAL SIGNS: ED Triage Vitals  Enc Vitals Group     BP --      Pulse --      Resp --      Temp --      Temp src --      SpO2 --      Weight --      Height --      Head Cir --      Peak Flow --      Pain Score 09/24/15 0101 10     Pain Loc --      Pain Edu? --      Excl. in GC? --     Constitutional: Alert and oriented. Well appearing and in no acute distress. Eyes: Conjunctivae are normal. PERRL. EOMI. Head: Atraumatic. Nose: No congestion/rhinnorhea. Mouth/Throat: Mucous membranes are moist.  Oropharynx non-erythematous. Neck: No stridor.   Cardiovascular: Normal rate, regular rhythm. Grossly normal heart sounds.  Good peripheral circulation. Respiratory: Normal respiratory effort.  No retractions. Lungs CTAB. Gastrointestinal: Soft with moderate to severe tenderness in the epigastrium and the right upper quadrant. Obese body habitus.  No CVA tenderness. Genitourinary: deferred Musculoskeletal: No lower extremity tenderness nor edema.  No joint effusions. Neurologic:  Normal speech and language. No gross focal neurologic deficits are appreciated. No gait instability. Skin:  Skin is warm, dry and intact. No rash noted. Psychiatric: Mood and affect are normal. Speech and behavior are normal.  ____________________________________________   LABS (  all labs ordered are listed, but only abnormal results are displayed)  Labs Reviewed  COMPREHENSIVE METABOLIC PANEL - Abnormal; Notable for the following:    Glucose, Bld 109 (*)    AST 52 (*)    Total Bilirubin <0.1 (*)    All other components within normal limits  URINALYSIS COMPLETEWITH MICROSCOPIC ([facility] ONLY) - Abnormal; Notable for the  following:    Color, Urine YELLOW (*)    APPearance HAZY (*)    Hgb urine dipstick 3+ (*)    Protein, ur 30 (*)    Squamous Epithelial / LPF 0-5 (*)    All other components within normal limits  HCG, QUANTITATIVE, PREGNANCY - Abnormal; Notable for the following:    hCG, Beta Chain, Quant, S 1001 (*)    All other components within normal limits  LIPASE, BLOOD  CBC   ____________________________________________  EKG  none ____________________________________________  RADIOLOGY  RUQ ultrasound IMPRESSION: Cholelithiasis. The gallbladder is contracted, and this could be pathologic gallbladder contraction if the patient is fasting.  ____________________________________________   PROCEDURES  Procedure(s) performed: None  Critical Care performed: No  ____________________________________________   INITIAL IMPRESSION / ASSESSMENT AND PLAN / ED COURSE  Pertinent labs & imaging results that were available during my care of the patient were reviewed by me and considered in my medical decision making (see chart for details).  Crystal Strickland is a 24 y.o. female  G54P1, unknown gestational age but LMP ~ 1 month ago with history of gallstones who presents for evaluation of one week continued right upper quadrant pain, gradual onset, intermittent since onset, currently severe and worse with eating/drinking. On exam, she is nontoxic appearing and in no acute distress. Vital signs stable, she is afebrile. She does have moderate to severe tenderness to palpation of the epigastrium and the right upper quadrant. Ultrasound on 09/22/2015 showed gallstones without evidence of cholecystitis however will obtain repeat ultrasound to evaluate for any evidence of cholecystitis today given exacerbation of pain and severe tenderness on exam. Labs reviewed. Lipase is unremarkable. CMP notable for mild AST elevation at 52. Normal CBC without leukocytosis. Beta hCG is 1001, increased from 883 yesterday  however repeat transvaginal ultrasound still unlikely to determine location of pregnancy therefore the patient's A diagnosis of pregnancy of undetermined location and will require follow-up with OB/GYN for serial beta hCG check. We'll treat her pain. We discussed possible risks to her fetus in the setting of morphine and Zofran administration especially this early in pregnancy, she voices understanding of risks and wishes to move forward with the above treatment for her pain.  ----------------------------------------- 5:10 AM on 09/24/2015 -----------------------------------------  Patient with significant improvement of her pain. Right upper quadrant ultrasound shows continued cholelithiasis without evidence of acute cholecystitis. Urinalysis negative for evidence of infection. We discussed return precautions, need for close surgery follow-up as an outpatient as well as need for close OB/GYN follow-up in 48 hours for recheck beta hCG. She voices understanding of this and is comfortable with the discharge plan. We discussed appropriate pain control with Tylenol and I will prescribe a very short course of Roxicodone for breakthrough pain as she reports she was not discharged from the ER last night with any pain medications. ____________________________________________   FINAL CLINICAL IMPRESSION(S) / ED DIAGNOSES  Final diagnoses:  Pain  RUQ abdominal pain  Calculus of gallbladder without cholecystitis without obstruction      Gayla Doss, MD 09/24/15 [phone]

## 2015-09-24 NOTE — ED Notes (Signed)
Pt seen here last night at diagnosed with gallstones; was also told she was pregnant-LMP 1 month ago; was told too early to tell when she's due; pt unable to sit still in wheelchair; vomited upon arrival; pt tearful; says she can't breathe but talking in complete coherent sentences; pt says pain to epigastric area that radiates through to her back; pain tonight is worse than last night

## 2015-09-24 NOTE — ED Notes (Signed)
Pt dc home ambulatory instructed on med use and follow up plan PT NAD AT DC

## 2015-09-27 ENCOUNTER — Encounter: Payer: Self-pay | Admitting: Obstetrics and Gynecology

## 2015-09-27 ENCOUNTER — Ambulatory Visit (INDEPENDENT_AMBULATORY_CARE_PROVIDER_SITE_OTHER): Payer: Medicaid Other | Admitting: Obstetrics and Gynecology

## 2015-09-27 ENCOUNTER — Other Ambulatory Visit: Payer: Medicaid Other

## 2015-09-27 VITALS — BP 126/78 | HR 76 | Ht 68.0 in | Wt 329.8 lb

## 2015-09-27 DIAGNOSIS — B009 Herpesviral infection, unspecified: Secondary | ICD-10-CM

## 2015-09-27 DIAGNOSIS — O24410 Gestational diabetes mellitus in pregnancy, diet controlled: Secondary | ICD-10-CM

## 2015-09-27 DIAGNOSIS — Z36 Encounter for antenatal screening of mother: Secondary | ICD-10-CM | POA: Diagnosis not present

## 2015-09-27 DIAGNOSIS — Z3687 Encounter for antenatal screening for uncertain dates: Secondary | ICD-10-CM

## 2015-09-27 DIAGNOSIS — O98511 Other viral diseases complicating pregnancy, first trimester: Secondary | ICD-10-CM

## 2015-09-27 DIAGNOSIS — O99331 Smoking (tobacco) complicating pregnancy, first trimester: Secondary | ICD-10-CM

## 2015-09-27 NOTE — Progress Notes (Signed)
NEW PATIENT EVALUATION.  Referring facility: [facility] emergency room Chief complaint: Early pregnancy, threatened abortion.  SUBJECTIVE: The patient is a 24 year old white female, gravida 3, para 1011, unsure last menstrual period, EDC uncertain, EGA unknown, who presents in referral from the emergency room for evaluation of pregnancy viability.  The patient presented to the emergency room on 09/24/2015 with gallstone issues as well as vaginal bleeding.  She was noted to be pregnant with a quantitative hCG titer of 800 milli international units; Blood type is O+; too early for viability scan.  Patient was treated for her gallbladder disease and was sent home for follow-up with OB/GYN.  Patient has not had any further bleeding.  She does have first trimester nausea with vomiting.  She is taking gummy vitamins; she does not have an  Anti-emetic  Therapy.  Past medical history: Obesity, Morbid Cholelithiasis. Gestational diabetes mellitus prior pregnancy. GBS carrier, prior pregnancy. History of HSV 2  Past surgical history: None.  Past OB history: G1-SAB, not requiring D&C. G2-SVD, complicated by gestational diabetes mellitus G3-current  Family history: Not obtained.  Social history: Tobacco user.  Less than 1/4 pack of cigarettes a day Alcohol-none. Drugs-none  OBJECTIVE: BP 126/78 mmHg  Pulse 76  Ht 5\' 8"  (1.727 m)  Wt 329 lb 12.8 oz (149.596 kg)  BMI 50.16 kg/m2  LMP 08/23/2015 (LMP Unknown) Pleasant obese white female in no acute distress.  She is alert and oriented. Back: No CVA tenderness. Abdomen: Protuberant, large pannus, no organomegaly. Pelvic: External genitalia-normal BUS-normal Vagina-normal without discharge. Cervix-closed; no discharge or bleeding. Uterus-difficult to palpate due to body habitus, nontender Adnexa-nontender Rectum-normal.  External exam  ASSESSMENT: 1. Threatened abortion, first trimester; Rh+, without need for  RhoGAM 2.  History of gestational diabetes mellitus. 3.  Obesity. 4.  Nausea and vomiting of pregnancy 5.  Tobacco user  PLAN: 1.  Pelvic ultrasound. 2.  Follow-up after ultrasound for further management planning once viability is confirmed and EDC is established. 3.  Zofran, ODT for  Nausea and vomiting 4.  Strongly encouraged  tobacco cessation  Herold HarmsMartin A Virgin Zellers, MD

## 2015-09-28 MED ORDER — ONDANSETRON 4 MG PO TBDP: 4.0000 mg | ORAL_TABLET | Freq: Four times a day (QID) | ORAL | Status: AC | PRN

## 2015-09-28 NOTE — Patient Instructions (Signed)
1.  Ultrasound is scheduled to determine fetal viability and to date. 2.  Zofran is prescribed for nausea. 3.  Return in 1 week after ultrasound for further management planning regarding pregnancy. 4.  Smoking cessation is encouraged

## 2015-09-29 ENCOUNTER — Ambulatory Visit (INDEPENDENT_AMBULATORY_CARE_PROVIDER_SITE_OTHER): Payer: Medicaid Other

## 2015-09-29 DIAGNOSIS — Z3687 Encounter for antenatal screening for uncertain dates: Secondary | ICD-10-CM

## 2015-09-29 DIAGNOSIS — Z36 Encounter for antenatal screening of mother: Secondary | ICD-10-CM

## 2015-10-17 ENCOUNTER — Encounter: Payer: Medicaid Other | Admitting: Obstetrics and Gynecology

## 2015-10-17 ENCOUNTER — Ambulatory Visit (INDEPENDENT_AMBULATORY_CARE_PROVIDER_SITE_OTHER): Payer: Medicaid Other

## 2015-10-17 ENCOUNTER — Ambulatory Visit (INDEPENDENT_AMBULATORY_CARE_PROVIDER_SITE_OTHER): Payer: Medicaid Other | Admitting: Obstetrics and Gynecology

## 2015-10-17 ENCOUNTER — Encounter: Payer: Self-pay | Admitting: Obstetrics and Gynecology

## 2015-10-17 VITALS — BP 121/62 | HR 72 | Ht 68.0 in | Wt 325.6 lb

## 2015-10-17 DIAGNOSIS — O039 Complete or unspecified spontaneous abortion without complication: Secondary | ICD-10-CM | POA: Diagnosis not present

## 2015-10-17 DIAGNOSIS — Z36 Encounter for antenatal screening of mother: Secondary | ICD-10-CM | POA: Diagnosis not present

## 2015-10-17 DIAGNOSIS — Z3687 Encounter for antenatal screening for uncertain dates: Secondary | ICD-10-CM

## 2015-10-17 NOTE — Patient Instructions (Signed)
Miscarriage  A miscarriage is the sudden loss of an unborn baby (fetus) before the 20th week of pregnancy. Most miscarriages happen in the first 3 months of pregnancy. Sometimes, it happens before a woman even knows she is pregnant. A miscarriage is also called a "spontaneous miscarriage" or "early pregnancy loss." Having a miscarriage can be an emotional experience. Talk with your caregiver about any questions you may have about miscarrying, the grieving process, and your future pregnancy plans.  CAUSES    Problems with the fetal chromosomes that make it impossible for the baby to develop normally. Problems with the baby's genes or chromosomes are most often the result of errors that occur, by chance, as the embryo divides and grows. The problems are not inherited from the parents.   Infection of the cervix or uterus.    Hormone problems.    Problems with the cervix, such as having an incompetent cervix. This is when the tissue in the cervix is not strong enough to hold the pregnancy.    Problems with the uterus, such as an abnormally shaped uterus, uterine fibroids, or congenital abnormalities.    Certain medical conditions.    Smoking, drinking alcohol, or taking illegal drugs.    Trauma.   Often, the cause of a miscarriage is unknown.   SYMPTOMS    Vaginal bleeding or spotting, with or without cramps or pain.   Pain or cramping in the abdomen or lower back.   Passing fluid, tissue, or blood clots from the vagina.  DIAGNOSIS   Your caregiver will perform a physical exam. You may also have an ultrasound to confirm the miscarriage. Blood or urine tests may also be ordered.  TREATMENT    Sometimes, treatment is not necessary if you naturally pass all the fetal tissue that was in the uterus. If some of the fetus or placenta remains in the body (incomplete miscarriage), tissue left behind may become infected and must be removed. Usually, a dilation and curettage (D and C) procedure is performed.  During a D and C procedure, the cervix is widened (dilated) and any remaining fetal or placental tissue is gently removed from the uterus.   Antibiotic medicines are prescribed if there is an infection. Other medicines may be given to reduce the size of the uterus (contract) if there is a lot of bleeding.   If you have Rh negative blood and your baby was Rh positive, you will need a Rh immunoglobulin shot. This shot will protect any future baby from having Rh blood problems in future pregnancies.  HOME CARE INSTRUCTIONS    Your caregiver may order bed rest or may allow you to continue light activity. Resume activity as directed by your caregiver.   Have someone help with home and family responsibilities during this time.    Keep track of the number of sanitary pads you use each day and how soaked (saturated) they are. Write down this information.    Do not use tampons. Do not douche or have sexual intercourse until approved by your caregiver.    Only take over-the-counter or prescription medicines for pain or discomfort as directed by your caregiver.    Do not take aspirin. Aspirin can cause bleeding.    Keep all follow-up appointments with your caregiver.    If you or your partner have problems with grieving, talk to your caregiver or seek counseling to help cope with the pregnancy loss. Allow enough time to grieve before trying to get pregnant again.     SEEK IMMEDIATE MEDICAL CARE IF:    You have severe cramps or pain in your back or abdomen.   You have a fever.   You pass large blood clots (walnut-sized or larger) ortissue from your vagina. Save any tissue for your caregiver to inspect.    Your bleeding increases.    You have a thick, bad-smelling vaginal discharge.   You become lightheaded, weak, or you faint.    You have chills.   MAKE SURE YOU:   Understand these instructions.   Will watch your condition.   Will get help right away if you are not doing well or get worse.     This  information is not intended to replace advice given to you by your health care provider. Make sure you discuss any questions you have with your health care provider.     Document Released: 04/02/2001 Document Revised: 02/01/2013 Document Reviewed: 11/26/2011  Elsevier Interactive Patient Education 2016 [employer]

## 2015-10-17 NOTE — Progress Notes (Signed)
GYNECOLOGY CLINIC PROGRESS NOTE  Subjective:    Crystal Strickland is a 24 y.o. 53P1011 female. Toriann reports light bleeding since 3 weeks ago on and off, however has had persistent heavier bleeding over the past 3-4 days.  Notes discovery of pregnancy in ER ~ 3 weeks ago. She is not in acute distress. Ectopic risks: none.   Pregnancy testing: quant HCG level 1001 on 09/24/2015.  Pregnancy imaging: transvaginal ultrasound done on today. Result No evidence of IUP, gestational sac. Blood type: O positive.   The following portions of the patient's history were reviewed and updated as appropriate: allergies, current medications, past family history, past medical history, past social history, past surgical history and problem list.  Review of Systems A comprehensive review of systems was negative except for: Constitutional: positive for fatigue Neurological: positive for dizziness   Objective:     BP 121/62 mmHg  Pulse 72  Ht 5\' 8"  (1.727 m)  Wt 325 lb 9.6 oz (147.691 kg)  BMI 49.52 kg/m2  LMP 08/23/2015 (LMP Unknown) General:   alert and no distress, morbidly obese  Heart: regular rate and rhythm, S1, S2 normal, no murmur, click, rub or gallop  Lungs: clear to auscultation bilaterally  Abdomen: soft, non-tender, without masses or organomegaly  Pelvic: Vulva and vagina appear normal. Bimanual exam reveals normal uterus and adnexa. Vaginal: vaginal vault, small amount of dark red blood.  Cervix closed.   Imaging 10/17/2015:   Indications:Unsure LMP Findings:  No IUP visualizedThe clinically established (LMP) EDD of 05/29/16. CRL and Gestational sac is not present   Right Ovary measures 3.2 x 1.4 x 2.1 cm. It is normal in appearance. Left Ovary measures 2.8 x 2 x 2 cm. It is normal appearance. Endometrium: 4.68mm There is evidence of a corpus luteal cyst in the Left Survey of the adnexa demonstrates no adnexal masses. There is no free peritoneal fluid in the cul de  sac.  Impression: 1. No IUP seen.  Recommendations: 1.Clinical correlation with the patient's History and Physical Exam.   Assessment:    Bleeding in early pregnancy, likely SAB (complete), however cannot r/o ectopic.   Plan:   - Quantitative hCG today. Will determine if further hCGs warranted based on today's results. - Warning signs discussed: to call for increased bleeding, abdominal or shoulder pain, light headedness, or if she has any concerns.   - Will order Hgb/Hct as patient notes dizziness, fatigue.  - Discussed contraception, patient notes that she does desire, but unsure of which type just yet.  Is aware of all contraceptive options.  Advised to follow up once method has been chosen.   Hildred LaserAnika Batoul Limes, MD [facility]

## 2015-10-17 NOTE — Addendum Note (Signed)
Addended by: Debbe BalesGLANTON, Alcus Bradly C on: 10/17/2015 11:20 AM   Modules accepted: Orders

## 2015-10-18 ENCOUNTER — Telehealth: Payer: Self-pay | Admitting: Obstetrics and Gynecology

## 2015-10-18 LAB — BETA HCG QUANT (REF LAB): HCG QUANT: 1 m[IU]/mL

## 2015-10-18 LAB — HEMOGLOBIN AND HEMATOCRIT, BLOOD
Hematocrit: 37.8 % (ref 34.0–46.6)
Hemoglobin: 12.3 g/dL (ref 11.1–15.9)

## 2015-10-18 NOTE — Telephone Encounter (Signed)
Patient returned call

## 2015-10-18 NOTE — Telephone Encounter (Signed)
Contacted patient regarding lab results performed yesterday.  Left message on voicemail for patient to return call.

## 2015-10-18 NOTE — Telephone Encounter (Signed)
Called pt informed her of beta levels confirming miscarriage, and normal hemoglobin.

## 2016-02-07 ENCOUNTER — Encounter: Payer: Self-pay | Admitting: Medical Oncology

## 2016-02-07 ENCOUNTER — Emergency Department
Admission: EM | Admit: 2016-02-07 | Discharge: 2016-02-07 | Disposition: A | Discharge status: Home / Self Care | Payer: Medicaid Other | Attending: Emergency Medicine | Admitting: Emergency Medicine

## 2016-02-07 DIAGNOSIS — F17210 Nicotine dependence, cigarettes, uncomplicated: Secondary | ICD-10-CM | POA: Insufficient documentation

## 2016-02-07 DIAGNOSIS — J029 Acute pharyngitis, unspecified: Secondary | ICD-10-CM | POA: Diagnosis present

## 2016-02-07 DIAGNOSIS — J039 Acute tonsillitis, unspecified: Secondary | ICD-10-CM

## 2016-02-07 LAB — POCT RAPID STREP A: STREPTOCOCCUS, GROUP A SCREEN (DIRECT): NEGATIVE

## 2016-02-07 MED ORDER — LIDOCAINE VISCOUS 2 % MT SOLN: 5.0000 mL | Freq: Four times a day (QID) | OROMUCOSAL | Status: DC | PRN

## 2016-02-07 MED ORDER — KETOROLAC TROMETHAMINE 60 MG/2ML IM SOLN
30.0000 mg | Freq: Once | INTRAMUSCULAR | Status: AC
  Administered 2016-02-07: 30 mg via INTRAMUSCULAR

## 2016-02-07 MED ORDER — LIDOCAINE VISCOUS 2 % MT SOLN
15.0000 mL | Freq: Once | OROMUCOSAL | Status: AC
  Administered 2016-02-07: 15 mL via OROMUCOSAL
  Filled 2016-02-07: qty 15

## 2016-02-07 MED ORDER — DIPHENHYDRAMINE HCL 12.5 MG/5ML PO ELIX
25.0000 mg | ORAL_SOLUTION | Freq: Once | ORAL | Status: AC
  Administered 2016-02-07: 25 mg via ORAL
  Filled 2016-02-07: qty 10

## 2016-02-07 MED ORDER — PROMETHAZINE-DM 6.25-15 MG/5ML PO SYRP: 5.0000 mL | ORAL_SOLUTION | Freq: Four times a day (QID) | ORAL | Status: DC | PRN

## 2016-02-07 MED ORDER — KETOROLAC TROMETHAMINE 30 MG/ML IJ SOLN
30.0000 mg | Freq: Once | INTRAMUSCULAR | Status: DC
  Filled 2016-02-07: qty 1

## 2016-02-07 MED ORDER — IBUPROFEN 800 MG PO TABS: 800.0000 mg | ORAL_TABLET | Freq: Three times a day (TID) | ORAL | Status: DC | PRN

## 2016-02-07 MED ORDER — AMOXICILLIN 500 MG PO CAPS: 500.0000 mg | ORAL_CAPSULE | Freq: Three times a day (TID) | ORAL | Status: DC

## 2016-02-07 NOTE — ED Notes (Signed)
Pt reports body aches, chills, sore throat that began yesterday.

## 2016-02-07 NOTE — Discharge Instructions (Signed)
Tonsillitis °Tonsillitis is an infection of the throat. This infection causes the tonsils to become red, tender, and puffy (swollen). Tonsils are groups of tissue at the back of your throat. If bacteria caused your infection, antibiotic medicine will be given to you. Sometimes symptoms of tonsillitis can be relieved with the use of steroid medicine. If your tonsillitis is severe and happens often, you may need to get your tonsils removed (tonsillectomy). °HOME CARE  °· Rest and sleep often. °· Drink enough fluids to keep your pee (urine) clear or pale yellow. °· While your throat is sore, eat soft or liquid foods like: °¨ Soup. °¨ Ice cream. °¨ Instant breakfast drinks. °· Eat frozen ice pops. °· Gargle with a warm or cold liquid to help soothe the throat. Gargle with a water and salt mix. Mix 1/4 teaspoon of salt and 1/4 teaspoon of baking soda in 1 cup of water. °· Only take medicines as told by your doctor. °· If you are given medicines (antibiotics), take them as told. Finish them even if you start to feel better. °GET HELP IF: °· You have large, tender lumps in your neck. °· You have a rash. °· You cough up green, yellow-brown, or bloody fluid. °· You cannot swallow liquids or food for 24 hours. °· You notice that only one of your tonsils is swollen. °GET HELP RIGHT AWAY IF:  °· You throw up (vomit). °· You have a very bad headache. °· You have a stiff neck. °· You have chest pain. °· You have trouble breathing or swallowing. °· You have bad throat pain, drooling, or your voice changes. °· You have bad pain not helped by medicine. °· You cannot fully open your mouth. °· You have redness, puffiness, or bad pain in the neck. °· You have a fever. °MAKE SURE YOU:  °· Understand these instructions. °· Will watch your condition. °· Will get help right away if you are not doing well or get worse. °  °This information is not intended to replace advice given to you by your health care provider. Make sure you discuss any  questions you have with your health care provider. °  °Document Released: 03/25/2008 Document Revised: 10/12/2013 Document Reviewed: 03/26/2013 °Elsevier Interactive Patient Education ©2016 [employer] ° °

## 2016-02-07 NOTE — ED Provider Notes (Signed)
[facility] Emergency Department Provider Note  ____________________________________________  Time seen: Approximately 5:28 PM  I have reviewed the triage vital signs and the nursing notes.   HISTORY  Chief Complaint Sore Throat and Fever    HPI Crystal Strickland is a 25 y.o. female patient complaining of body aches and chills and sore throat for 2 days. Patient she'll fever but does feel hot. No palliative measures taken for this complaint. Patient rates her pain discomfort as a 8/10. Patient described her pain as "achy". Patient describes her sore throat as "sharp". Patient states sore throat increases with swallowing. Patient able to tolerate fluids. Past Medical History  Diagnosis Date  . Gallstones   . GDM (gestational diabetes mellitus)     Patient Active Problem List   Diagnosis Date Noted  . Chlamydia trachomatis infection during pregnancy 12/12/2014  . High-risk pregnancy 12/12/2014  . 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 Maternal tobacco use 12/12/2014  . Diabetes mellitus arising in pregnancy 12/06/2014  . Morbid obesity (HCC) 12/06/2014    Past Surgical History  Procedure Laterality Date  . None      Current Outpatient Rx  Name  Route  Sig  Dispense  Refill  . acetaminophen (TYLENOL) 500 MG tablet   Oral   Take 1 tablet (500 mg total) by mouth every 6 (six) hours as needed for moderate pain. Patient not taking: Reported on 10/17/2015   20 tablet   0   . amoxicillin (AMOXIL) 500 MG capsule   Oral   Take 1 capsule (500 mg total) by mouth 3 (three) times daily.   30 capsule   0   . ibuprofen (ADVIL,MOTRIN) 800 MG tablet   Oral   Take 1 tablet (800 mg total) by mouth every 8 (eight) hours as needed.   30 tablet   0   . lidocaine (XYLOCAINE) 2 % solution   Mouth/Throat   Use as directed 5 mLs in the mouth or throat every 6 (six) hours as needed for mouth pain. Mixed with 5 mL of Phenergan DM. Swish and swallow.   100  mL   0   . ondansetron (ZOFRAN ODT) 4 MG disintegrating tablet   Oral   Take 1 tablet (4 mg total) by mouth every 6 (six) hours as needed for nausea. Patient not taking: Reported on 10/17/2015   20 tablet   0   . oxyCODONE (ROXICODONE) 5 MG immediate release tablet   Oral   Take 1 tablet (5 mg total) by mouth every 6 (six) hours as needed for breakthrough pain. Do not drive while taking this medication. Patient not taking: Reported on 10/17/2015   8 tablet   0   . promethazine-dextromethorphan (PROMETHAZINE-DM) 6.25-15 MG/5ML syrup   Oral   Take 5 mLs by mouth 4 (four) times daily as needed for cough. Mixed with viscous lidocaine swish and swallow.   118 mL   0     Allergies Review of patient's allergies indicates no known allergies.  Family History  Problem Relation Age of Onset  . Diabetes Father   . Cancer Neg Hx   . Heart disease Neg Hx     Social History Social History  Substance Use Topics  . Smoking status: Current Every Day Smoker -- 0.50 packs/day    Types: Cigarettes  . Smokeless tobacco: None  . Alcohol Use: No    Review of Systems Constitutional: Fever and chills. Patient states body aches Eyes: No visual changes. ENT:  Sore throat Cardiovascular: Denies chest pain. Respiratory: Denies shortness of breath. Gastrointestinal: No abdominal pain.  No nausea, no vomiting.  No diarrhea.  No constipation. Genitourinary: Negative for dysuria. Musculoskeletal: Negative for back pain. Skin: Negative for rash. Neurological: Negative for headaches, focal weakness or numbness.   ____________________________________________   PHYSICAL EXAM:  VITAL SIGNS: ED Triage Vitals  Enc Vitals Group     BP 02/07/16 1645 133/68 mmHg     Pulse Rate 02/07/16 1645 99     Resp 02/07/16 1645 18     Temp 02/07/16 1645 99.5 F (37.5 C)     Temp Source 02/07/16 1645 Oral     SpO2 02/07/16 1645 97 %     Weight 02/07/16 1645 325 lb (147.419 kg)     Height 02/07/16 1645  5\' 8"  (1.727 m)     Head Cir --      Peak Flow --      Pain Score 02/07/16 1646 8     Pain Loc --      Pain Edu? --      Excl. in [place]? --     Constitutional: Alert and oriented. Well appearing and in no acute distress. Eyes: Conjunctivae are normal. PERRL. EOMI. Head: Atraumatic. Nose: No congestion/rhinnorhea. Mouth/Throat: Mucous membranes are moist.  Oropharynx erythematous, edematous, erythematous, exudative tonsils Neck: No stridor.  No cervical spine tenderness to palpation. Hematological/Lymphatic/Immunilogical: Bilateral cervical lymphadenopathy. Cardiovascular: Normal rate, regular rhythm. Grossly normal heart sounds.  Good peripheral circulation. Respiratory: Normal respiratory effort.  No retractions. Lungs CTAB. Gastrointestinal: Soft and nontender. No distention. No abdominal bruits. No CVA tenderness. Musculoskeletal: No lower extremity tenderness nor edema.  No joint effusions. Neurologic:  Normal speech and language. No gross focal neurologic deficits are appreciated. No gait instability. Skin:  Skin is warm, dry and intact. No rash noted. Psychiatric: Mood and affect are normal. Speech and behavior are normal.  ____________________________________________   LABS (all labs ordered are listed, but only abnormal results are displayed)  Labs Reviewed  CULTURE, GROUP A STREP [facility])  POCT RAPID STREP A   ____________________________________________  EKG   ____________________________________________  RADIOLOGY   ____________________________________________   PROCEDURES  Procedure(s) performed: None  Critical Care performed: No  ____________________________________________   INITIAL IMPRESSION / ASSESSMENT AND PLAN / ED COURSE  Pertinent labs & imaging results that were available during my care of the patient were reviewed by me and considered in my medical decision making (see chart for details).  Exudative tonsillitis. Patient given discharge  care instructions. Patient given a work note. Prescription for amoxicillin, viscous lidocaine, and Phenergan DM. Advised [facility] ____________________________________________   FINAL CLINICAL IMPRESSION(S) / ED DIAGNOSES  Final diagnoses:  Exudative tonsillitis      Joni ReiningRonald K Alizon Schmeling, PA-C 02/07/16 1756  Joni Reiningonald K Essence Merle, PA-C 02/07/16 1757  Maurilio LovelyNoelle McLaurin, MD 02/07/16 [phone]

## 2016-02-08 LAB — CULTURE, GROUP A STREP (THRC)

## 2017-09-14 IMAGING — US US OB TRANSVAGINAL
1 series · 14 of 28 positions shown · non-contrast
Comparison: None.

CLINICAL DATA: Lower abdominal pain and spotting.

EXAM:
OBSTETRIC <14 WK US AND TRANSVAGINAL OB US
TECHNIQUE: Both transabdominal and transvaginal ultrasound examinations were
performed for complete evaluation of the gestation as well as the
maternal uterus, adnexal regions, and pelvic cul-de-sac.
Transvaginal technique was performed to assess early pregnancy.

[Series 1: us ob transvaginal · 0.24mm/px · 14 of 68 slices shown]
[im 3/68]
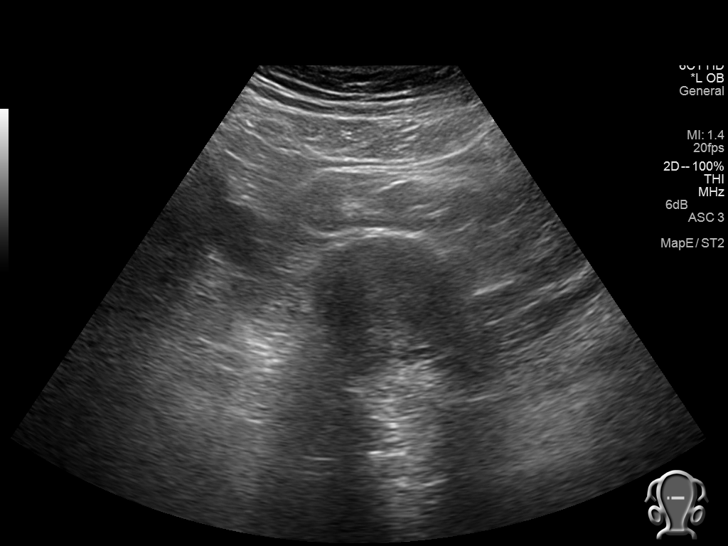
[im 8/68]
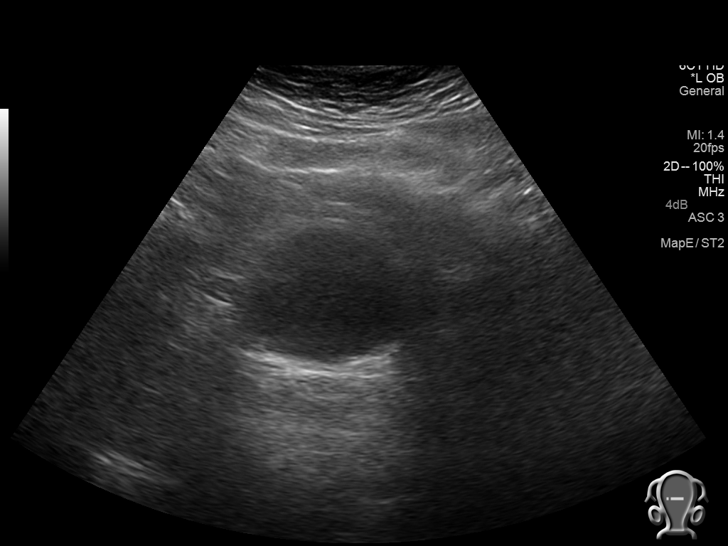
[im 13/68]
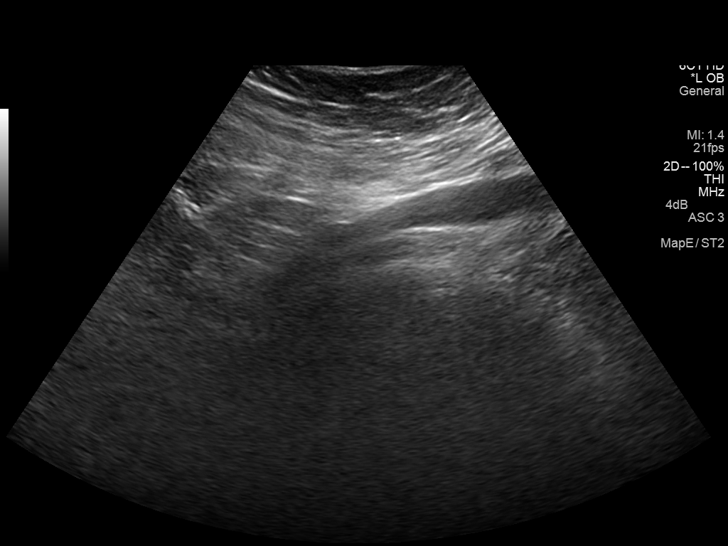
[im 18/68]
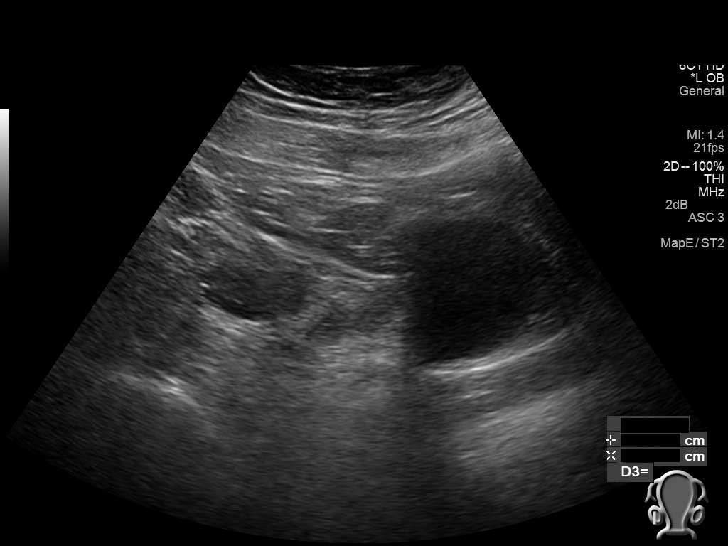
[im 23/68]
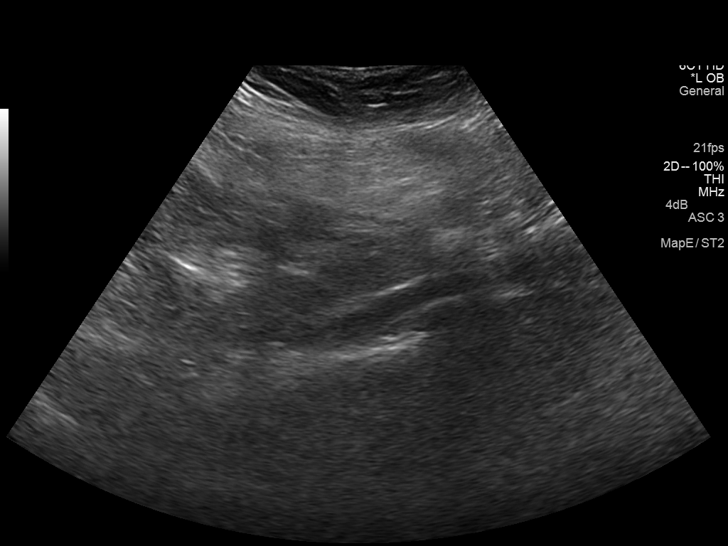
[im 28/68]
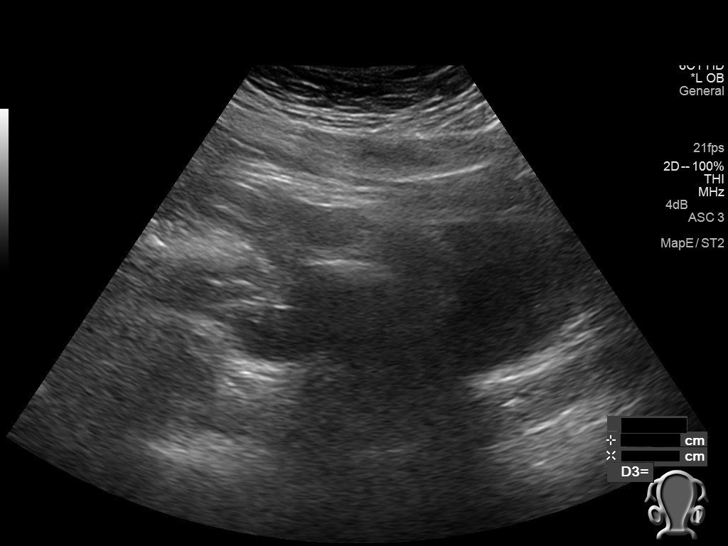
[im 33/68]
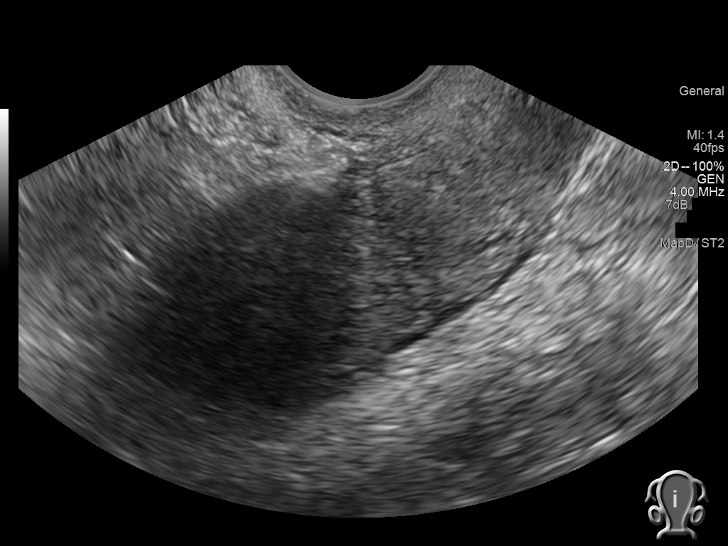
[im 38/68]
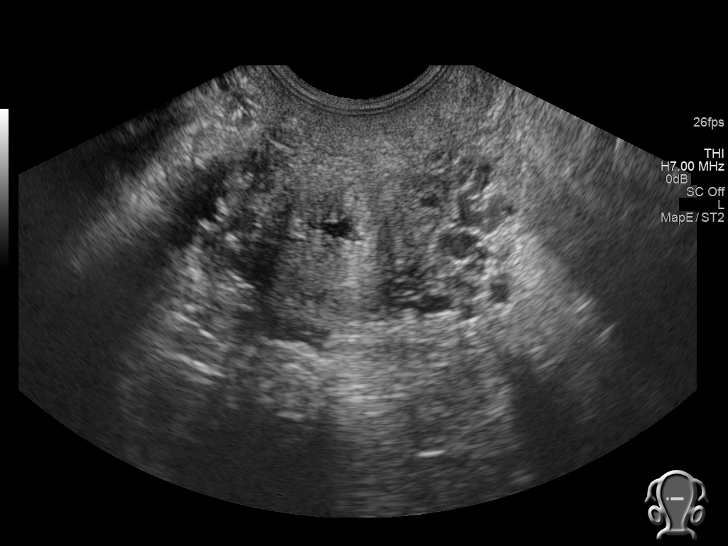
[im 43/68]
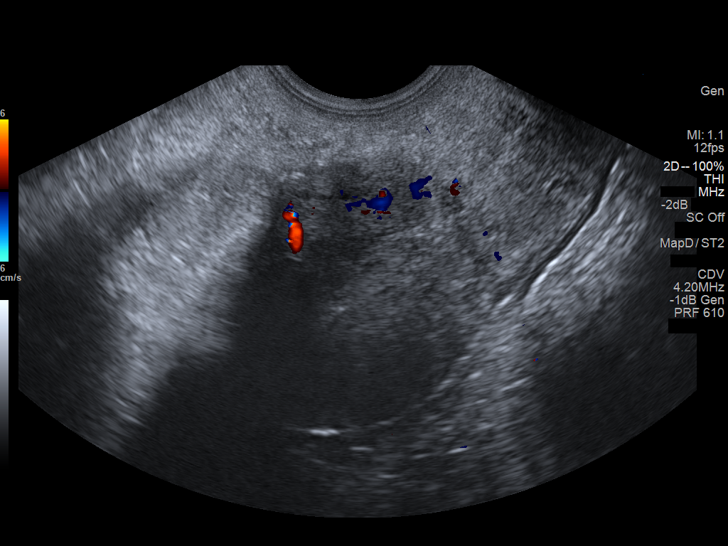
[im 48/68]
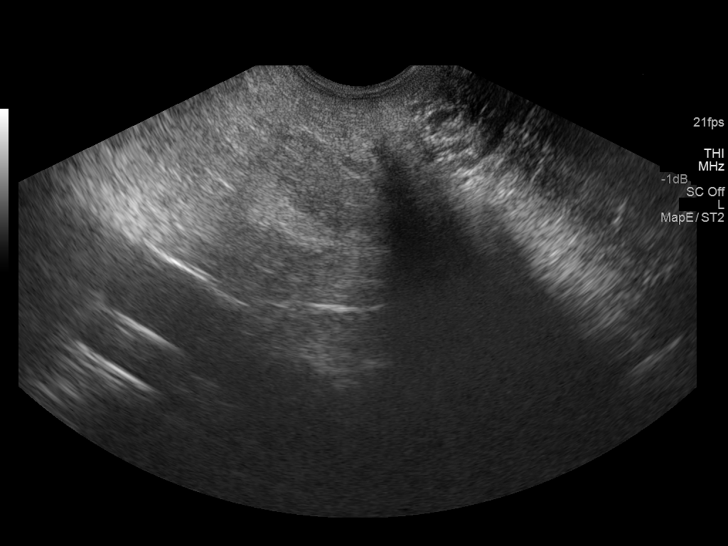
[im 53/68]
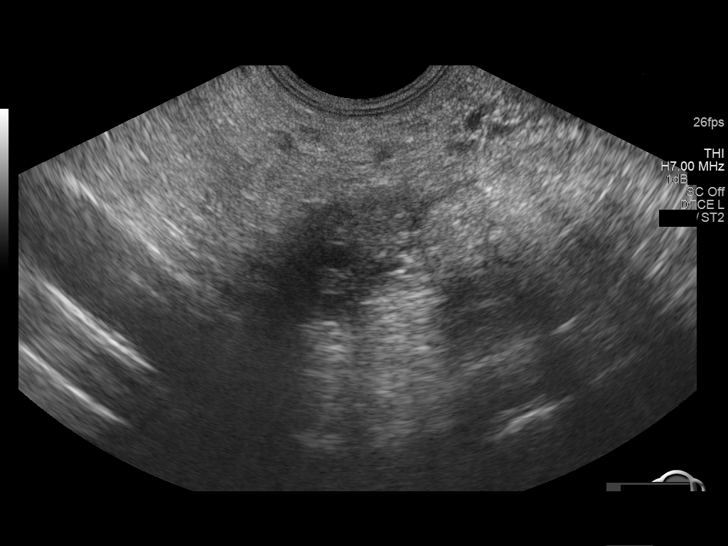
[im 58/68]
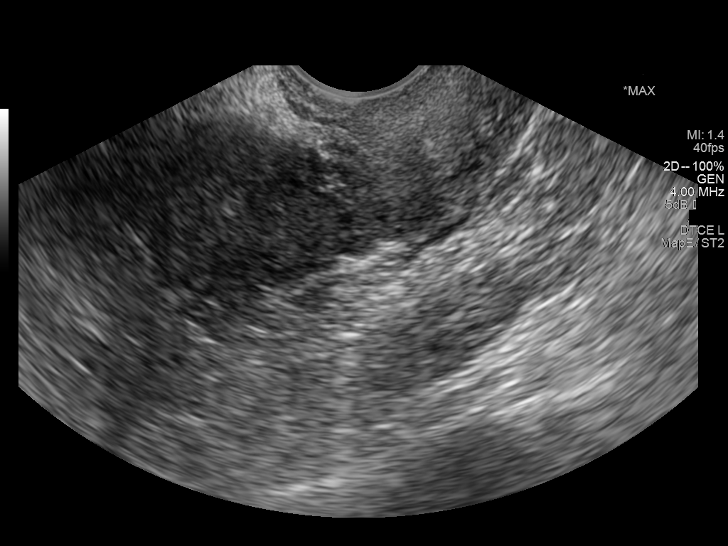
[im 63/68]
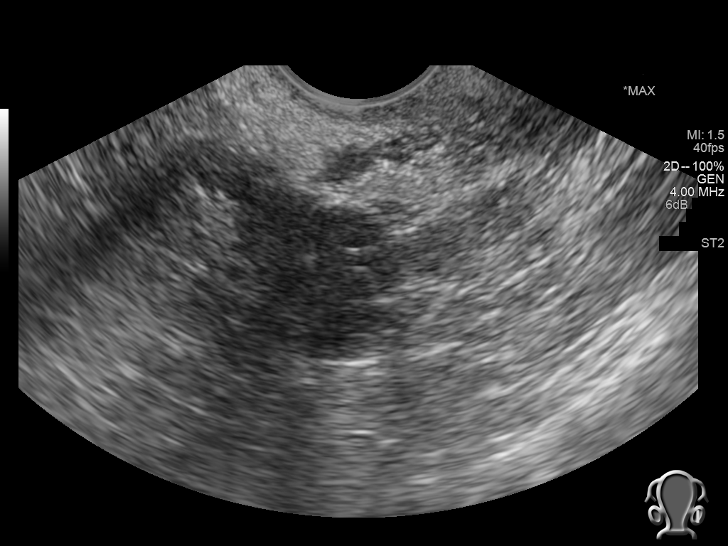
[im 68/68]
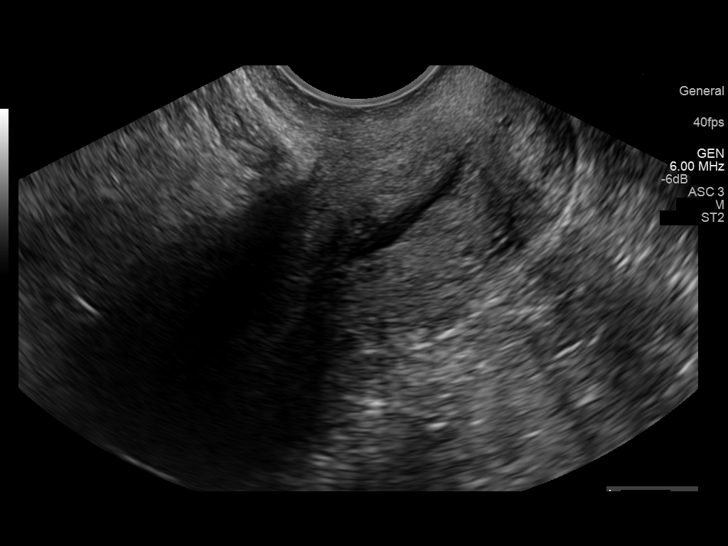

[14 of 28 positions shown; findings below may reference images not displayed]

FINDINGS: Intrauterine gestational sac: None

Yolk sac:  No

Embryo:  No

Cardiac Activity: No

Heart Rate:   bpm

MSD:   mm    w     d

CRL:    mm    w    d                  US EDC:

Maternal uterus/adnexae: Normal ovaries. No abnormal fluid
collections. Endometrium measures 11 mm. Small volume blood or fluid
in the endocervical canal.
IMPRESSION: No visible gestational sac. This may represent an intrauterine
gestation which is too small to visualize, but cannot exclude an
early failed pregnancy or an ectopic gestation.

## 2017-09-15 IMAGING — US US ABDOMEN LIMITED
1 series · 14 of 25 positions shown · non-contrast
Comparison: 09/23/2015

CLINICAL DATA: Right upper quadrant pain radiating to the back. Was
seen last night for the same issue. Now worse.

EXAM:
US ABDOMEN LIMITED - RIGHT UPPER QUADRANT

[Series 1: us abdomen limited · 0.24mm/px · 14 of 27 slices shown]
[im 1/27]
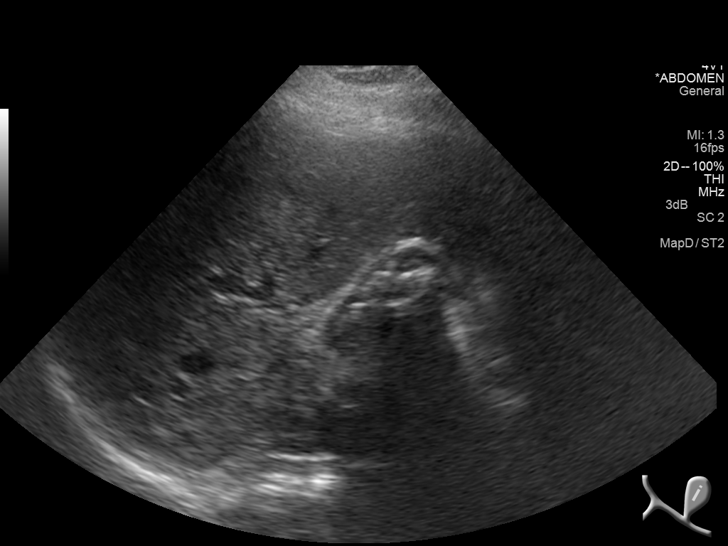
[im 3/27]
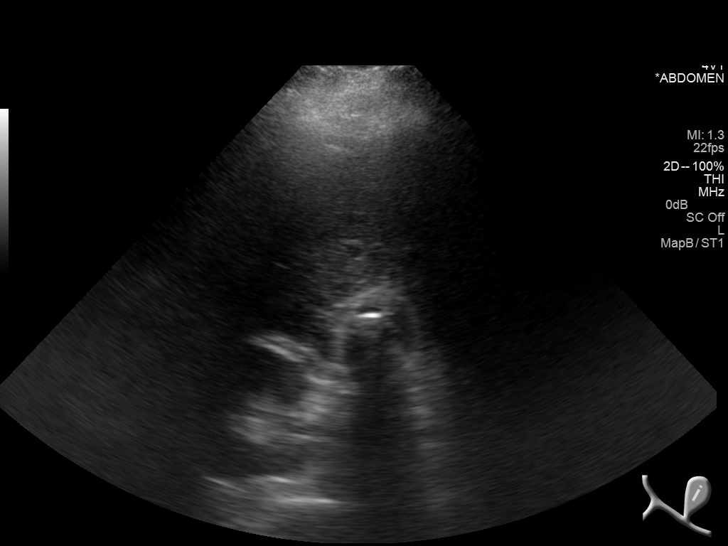
[im 5/27]
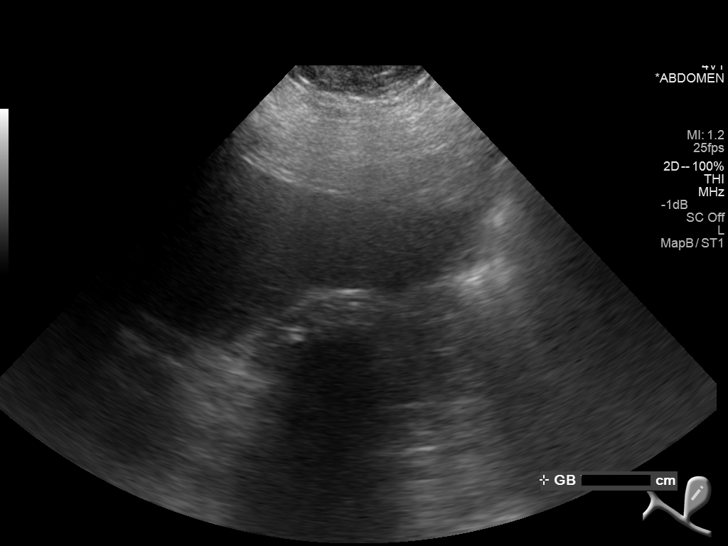
[im 7/27]
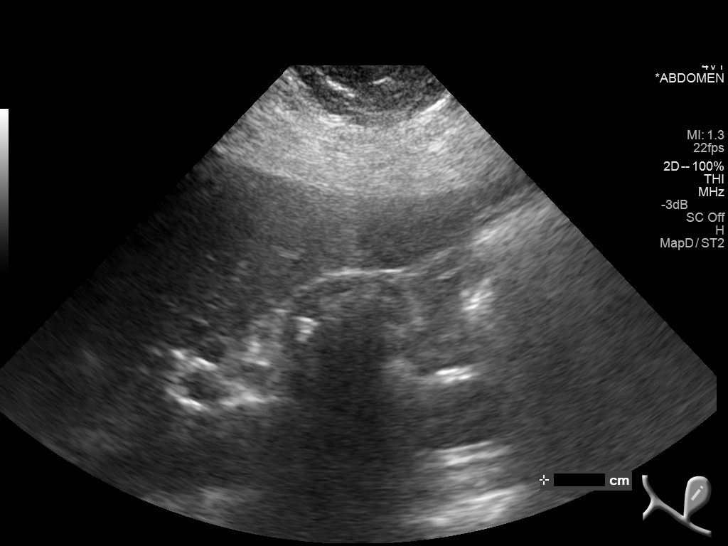
[im 9/27]
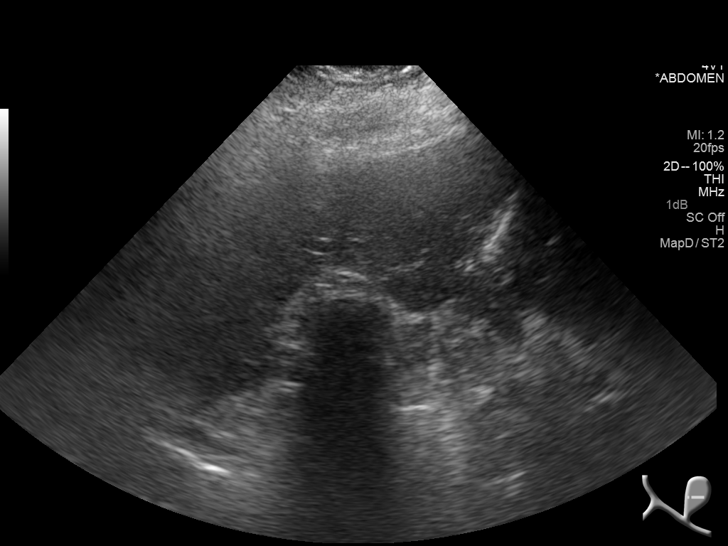
[im 10/27]
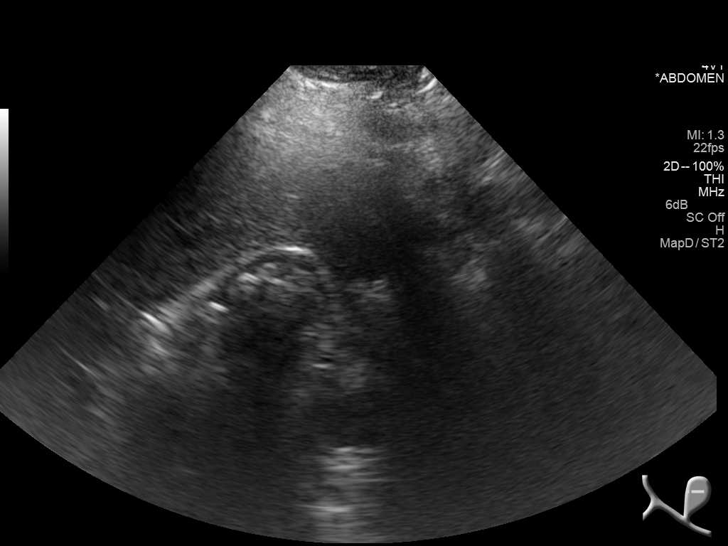
[im 12/27]
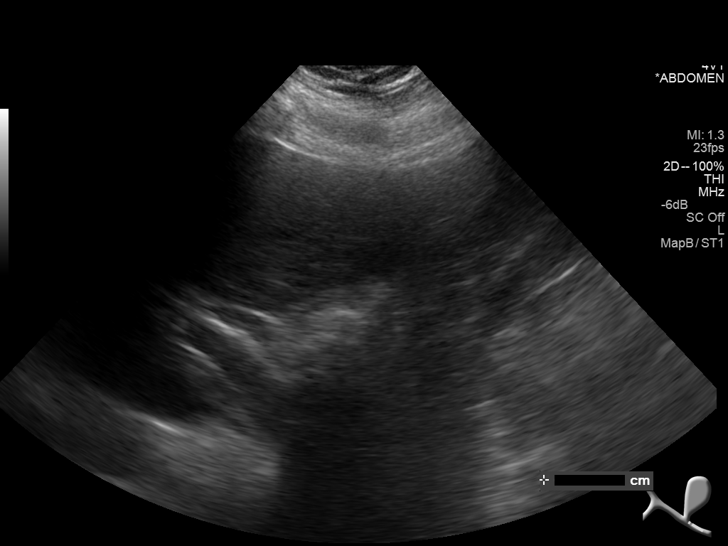
[im 15/27]
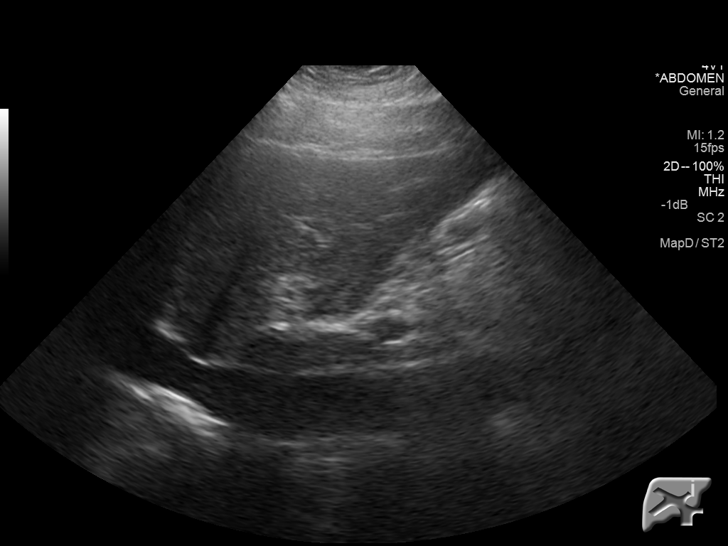
[im 17/27]
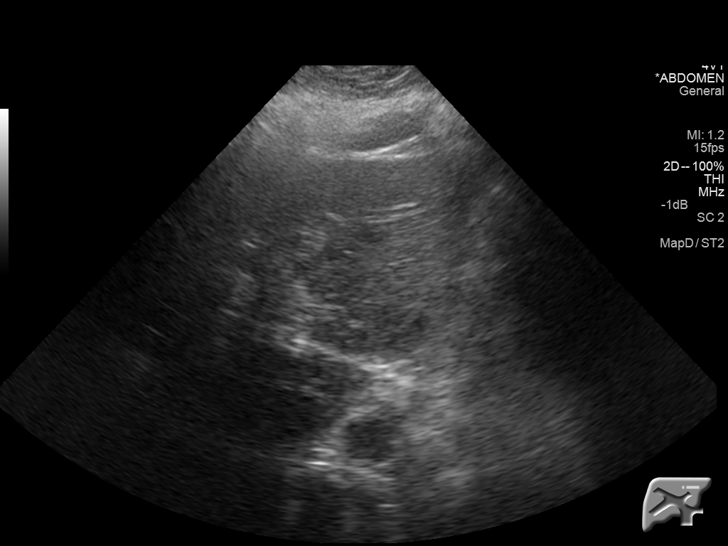
[im 18/27]
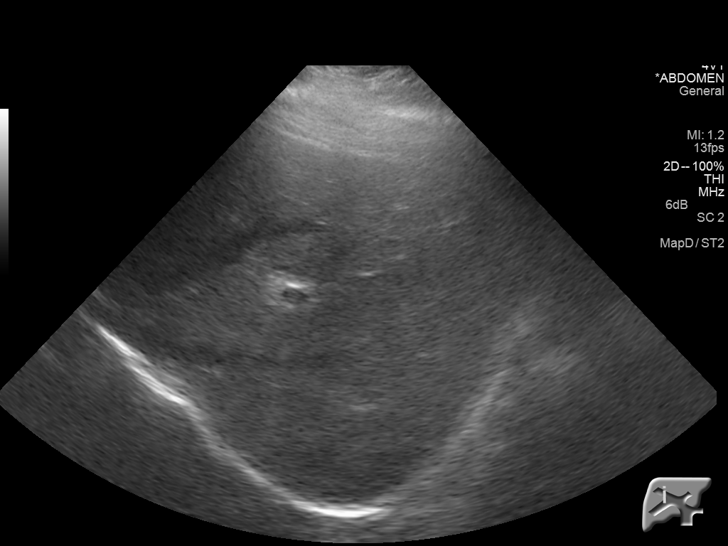
[im 20/27]
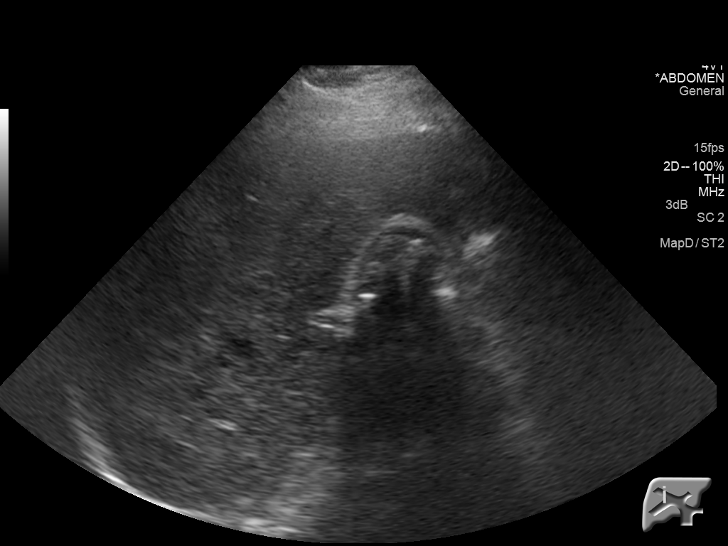
[im 22/27]
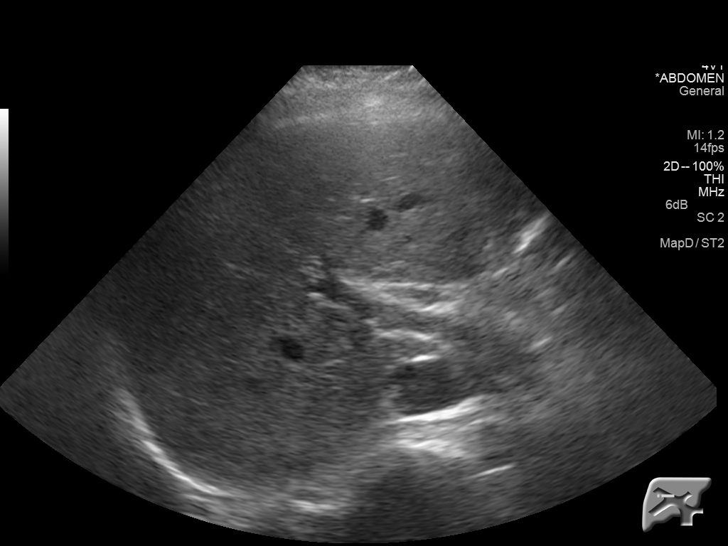
[im 24/27]
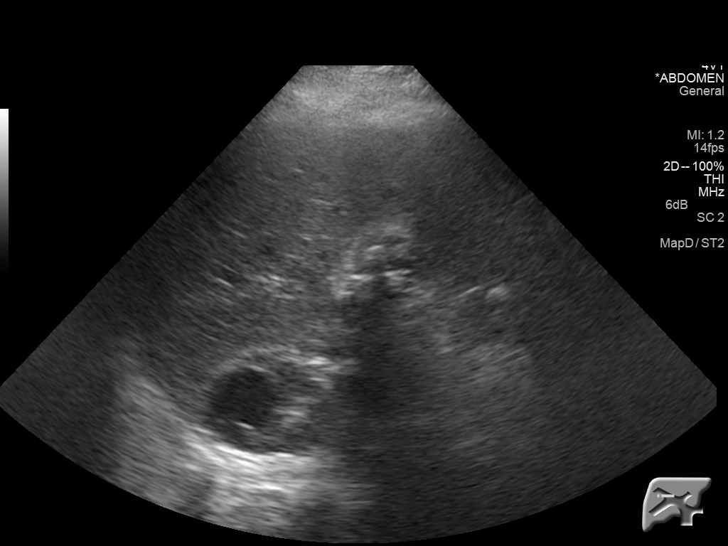
[im 27/27]
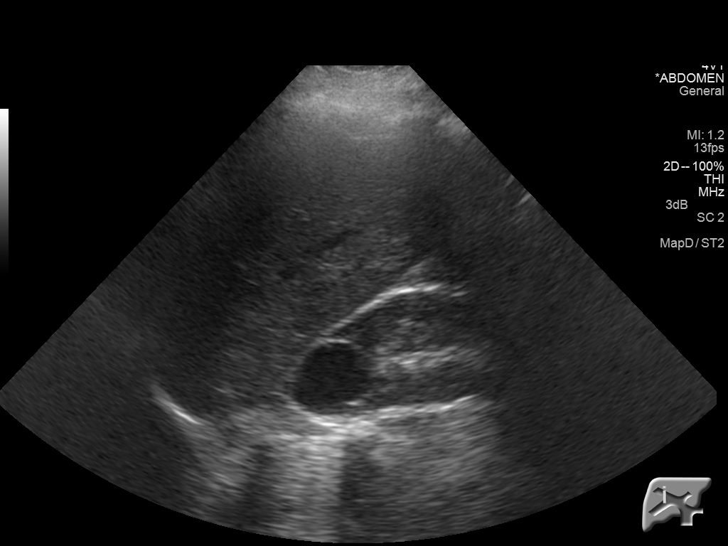

[14 of 25 positions shown; findings below may reference images not displayed]

FINDINGS: Gallbladder:

Multiple calculi within the gallbladder. The gallbladder is
contracted.

Common bile duct:

Diameter: Upper normal, 6.4 mm.

Liver:

No focal lesion identified. Within normal limits in parenchymal
echogenicity.
IMPRESSION: Cholelithiasis. The gallbladder is contracted, and this could be
pathologic gallbladder contraction if the patient is fasting.

## 2022-12-10 ENCOUNTER — Encounter: Payer: Self-pay | Admitting: Emergency Medicine

## 2022-12-10 ENCOUNTER — Other Ambulatory Visit: Payer: Self-pay

## 2022-12-10 DIAGNOSIS — R1110 Vomiting, unspecified: Secondary | ICD-10-CM | POA: Insufficient documentation

## 2022-12-10 DIAGNOSIS — R059 Cough, unspecified: Secondary | ICD-10-CM | POA: Insufficient documentation

## 2022-12-10 DIAGNOSIS — J020 Streptococcal pharyngitis: Secondary | ICD-10-CM | POA: Insufficient documentation

## 2022-12-10 DIAGNOSIS — Z1152 Encounter for screening for COVID-19: Secondary | ICD-10-CM | POA: Insufficient documentation

## 2022-12-10 LAB — COMPREHENSIVE METABOLIC PANEL
ALT: 11 U/L (ref 0–44)
AST: 14 U/L — ABNORMAL LOW (ref 15–41)
Albumin: 3.5 g/dL (ref 3.5–5.0)
Alkaline Phosphatase: 80 U/L (ref 38–126)
Anion gap: 9 (ref 5–15)
BUN: 9 mg/dL (ref 6–20)
CO2: 26 mmol/L (ref 22–32)
Calcium: 8.7 mg/dL — ABNORMAL LOW (ref 8.9–10.3)
Chloride: 102 mmol/L (ref 98–111)
Creatinine, Ser: 0.75 mg/dL (ref 0.44–1.00)
GFR, Estimated: >60 mL/min (ref >60)
Glucose, Bld: 106 mg/dL — ABNORMAL HIGH (ref 70–99)
Potassium: 3.8 mmol/L (ref 3.5–5.1)
Sodium: 137 mmol/L (ref 135–145)
Total Bilirubin: 0.7 mg/dL (ref 0.3–1.2)
Total Protein: 7.2 g/dL (ref 6.5–8.1)

## 2022-12-10 LAB — GROUP A STREP BY PCR: Group A Strep by PCR: DETECTED — AB

## 2022-12-10 LAB — CBC
HCT: 38.6 % (ref 36.0–46.0)
Hemoglobin: 12.4 g/dL (ref 12.0–15.0)
MCH: 27.1 pg (ref 26.0–34.0)
MCHC: 32.1 g/dL (ref 30.0–36.0)
MCV: 84.5 fL (ref 80.0–100.0)
Platelets: 206 10*3/uL (ref 150–400)
RBC: 4.57 MIL/uL (ref 3.87–5.11)
RDW: 14.1 % (ref 11.5–15.5)
WBC: 10.3 10*3/uL (ref 4.0–10.5)
nRBC: 0 % (ref 0.0–0.2)

## 2022-12-10 LAB — POC URINE PREG, ED: Preg Test, Ur: NEGATIVE

## 2022-12-10 LAB — LIPASE, BLOOD: Lipase: 36 U/L (ref 11–51)

## 2022-12-10 NOTE — ED Triage Notes (Addendum)
Pt arrived via POV with reports of sore throat x 4 days with fever of 104 at the highest, cough also present. 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 Pt states she has been lightheaded and cold all the time.  Pt has been vomiting most days which she describes as like when a baby is spitting up.  Pt had surgery at [facility] in [place] states she took tylenol about 90 mins prior to arrival.

## 2022-12-11 ENCOUNTER — Emergency Department
Admission: EM | Admit: 2022-12-11 | Discharge: 2022-12-11 | Disposition: A | Discharge status: Home / Self Care | Payer: Self-pay | Attending: Emergency Medicine | Admitting: Emergency Medicine

## 2022-12-11 DIAGNOSIS — J020 Streptococcal pharyngitis: Secondary | ICD-10-CM

## 2022-12-11 LAB — URINALYSIS, ROUTINE W REFLEX MICROSCOPIC
Bacteria, UA: NONE SEEN
RBC / HPF: >50 RBC/hpf (ref 0–5)
Specific Gravity, Urine: 1.021 (ref 1.005–1.030)

## 2022-12-11 LAB — RESP PANEL BY RT-PCR (RSV, FLU A&B, COVID)  RVPGX2
Influenza A by PCR: NEGATIVE
Influenza B by PCR: NEGATIVE
Resp Syncytial Virus by PCR: NEGATIVE
SARS Coronavirus 2 by RT PCR: NEGATIVE

## 2022-12-11 MED ORDER — DEXAMETHASONE 6 MG PO TABS
12.0000 mg | ORAL_TABLET | Freq: Once | ORAL | Status: AC
  Administered 2022-12-11: 12 mg via ORAL
  Filled 2022-12-11: qty 2

## 2022-12-11 MED ORDER — PENICILLIN G BENZATHINE 1200000 UNIT/2ML IM SUSY
1.2000 10*6.[IU] | PREFILLED_SYRINGE | Freq: Once | INTRAMUSCULAR | Status: AC
  Administered 2022-12-11: 1.2 10*6.[IU] via INTRAMUSCULAR
  Filled 2022-12-11: qty 2

## 2022-12-11 NOTE — Discharge Instructions (Signed)
Tylenol 500 to 1000 mg every 6 hours or ibuprofen 600 mg every 6 hours as needed for pain or fever.

## 2022-12-11 NOTE — ED Provider Notes (Signed)
[facility] Provider Note    Event Date/Time   First MD Initiated Contact with Patient 12/11/22 0139     (approximate)   History   Sore Throat and Emesis   HPI  Crystal Strickland is a 32 y.o. female with history of obesity and gallstones who presents with sore throat for the last several days associated with pain on swallowing but no significant difficulty swallowing.  The patient denies any shortness of breath.  She has had some cough but no nasal congestion or rhinorrhea.  She denies any acute vomiting or diarrhea.  The patient reports feeling somewhat lightheaded and cold ever since she had gastric bypass surgery in December and is concerned about her iron level.  She has had some spitting up since the surgery but no significant vomiting.  I reviewed the past medical records.  The patient's most recent documented outpatient encounter was with OB/GYN for pregnancy in 2016.  She has no recent ED visits or admissions.   Physical Exam   Triage Vital Signs: ED Triage Vitals  Enc Vitals Group     BP 12/10/22 2258 118/78     Pulse Rate 12/10/22 2258 91     Resp 12/10/22 2258 20     Temp 12/10/22 2258 99.6 F (37.6 C)     Temp Source 12/10/22 2258 Oral     SpO2 12/10/22 2258 98 %     Weight 12/10/22 2254 283 lb (128.4 kg)     Height 12/10/22 2254 5' 8"$  (1.727 m)     Head Circumference --      Peak Flow --      Pain Score 12/10/22 2254 8     Pain Loc --      Pain Edu? --      Excl. in [place]? --     Most recent vital signs: Vitals:   12/10/22 2258  BP: 118/78  Pulse: 91  Resp: 20  Temp: 99.6 F (37.6 C)  SpO2: 98%     General: Awake, no distress.  CV:  Good peripheral perfusion.  Resp:  Normal effort.  Lungs CTAB. Abd:  No distention.  Other:  Oropharynx erythematous with swollen tonsils and faint exudates.  No stridor or pooled secretions.  No cervical lymphadenopathy.   ED Results / Procedures / Treatments   Labs (all labs ordered are  listed, but only abnormal results are displayed) Labs Reviewed  GROUP A STREP BY PCR - Abnormal; Notable for the following components:      Result Value   Group A Strep by PCR DETECTED (*)    All other components within normal limits  COMPREHENSIVE METABOLIC PANEL - Abnormal; Notable for the following components:   Glucose, Bld 106 (*)    Calcium 8.7 (*)    AST 14 (*)    All other components within normal limits  URINALYSIS, ROUTINE W REFLEX MICROSCOPIC - Abnormal; Notable for the following components:   Color, Urine RED (*)    APPearance TURBID (*)    Glucose, UA   (*)    Value: TEST NOT REPORTED DUE TO COLOR INTERFERENCE OF URINE PIGMENT   Hgb urine dipstick   (*)    Value: TEST NOT REPORTED DUE TO COLOR INTERFERENCE OF URINE PIGMENT   Bilirubin Urine   (*)    Value: TEST NOT REPORTED DUE TO COLOR INTERFERENCE OF URINE PIGMENT   Ketones, ur   (*)    Value: TEST NOT REPORTED DUE TO COLOR INTERFERENCE  OF URINE PIGMENT   Protein, ur   (*)    Value: TEST NOT REPORTED DUE TO COLOR INTERFERENCE OF URINE PIGMENT   Nitrite   (*)    Value: TEST NOT REPORTED DUE TO COLOR INTERFERENCE OF URINE PIGMENT   Leukocytes,Ua   (*)    Value: TEST NOT REPORTED DUE TO COLOR INTERFERENCE OF URINE PIGMENT   All other components within normal limits  RESP PANEL BY RT-PCR (RSV, FLU A&B, COVID)  RVPGX2  LIPASE, BLOOD  CBC  POC URINE PREG, ED     EKG     RADIOLOGY     PROCEDURES:  Critical Care performed: No  Procedures   MEDICATIONS ORDERED IN ED: Medications  dexamethasone (DECADRON) tablet 12 mg (has no administration in time range)  penicillin g benzathine (BICILLIN LA) 1200000 UNIT/2ML injection 1.2 Million Units (has no administration in time range)     IMPRESSION / MDM / ASSESSMENT AND PLAN / ED COURSE  I reviewed the triage vital signs and the nursing notes.  32 year old female with PMH as noted above presents with sore throat and fever for the last several days.  She  also reports some lightheadedness since a gastric bypass surgery several months ago.  Differential diagnosis includes, but is not limited to, strep pharyngitis, viral pharyngitis, other viral syndrome.  Respiratory panel is negative.  Throat swab is positive for strep.  Other labs are unremarkable.  Electrolytes are normal.  There is no anemia or leukocytosis on the CBC.  Urinalysis shows significant RBCs consistent with blood contamination; the patient is currently on her period.  Patient's presentation is most consistent with acute complicated illness / injury requiring diagnostic workup.  The patient is well-appearing and her vital signs are normal in the ED.  She is stable for discharge.  I will treat with dexamethasone and IM penicillin.  The patient is instructed to use Tylenol and/or ibuprofen as needed for fever and pain at home.  I gave strict return precautions and she expressed understanding.   FINAL CLINICAL IMPRESSION(S) / ED DIAGNOSES   Final diagnoses:  Strep pharyngitis     Rx / DC Orders   ED Discharge Orders     None        Note:  This document was prepared using Dragon voice recognition software and may include unintentional dictation errors.    Arta Silence, MD 12/11/22 Reece Agar

## 2023-05-17 ENCOUNTER — Other Ambulatory Visit: Payer: Self-pay

## 2023-05-17 ENCOUNTER — Encounter: Payer: Self-pay | Admitting: Emergency Medicine

## 2023-05-17 ENCOUNTER — Emergency Department
Admission: EM | Admit: 2023-05-17 | Discharge: 2023-05-17 | Disposition: A | Discharge status: Home / Self Care | Payer: Self-pay | Attending: Emergency Medicine | Admitting: Emergency Medicine

## 2023-05-17 DIAGNOSIS — U071 COVID-19: Secondary | ICD-10-CM | POA: Insufficient documentation

## 2023-05-17 MED ORDER — GUAIFENESIN-CODEINE 100-10 MG/5ML PO SYRP: 5.0000 mL | ORAL_SOLUTION | Freq: Three times a day (TID) | ORAL | 0 refills | Status: DC | PRN

## 2023-05-17 NOTE — ED Provider Notes (Signed)
[facility] Provider Note    Event Date/Time   First MD Initiated Contact with Patient 05/17/23 1259     (approximate)   History   Covid Exposure   HPI  Crystal Strickland is a 32 y.o. female  with history of gestational diabetes and as listed in EMR presents to the emergency department for evaluation after 65 year old daughter tested positive for Covid and she now has symptoms. For the past 2 days, she has had a fever, headache, fatigue, and body aches. She has been taking OTC medications with little relief.      Physical Exam   Triage Vital Signs: ED Triage Vitals  Encounter Vitals Group     BP 05/17/23 1238 124/73     Systolic BP Percentile --      Diastolic BP Percentile --      Pulse Rate 05/17/23 1238 71     Resp 05/17/23 1238 16     Temp 05/17/23 1238 98.9 F (37.2 C)     Temp Source 05/17/23 1238 Oral     SpO2 05/17/23 1238 98 %     Weight 05/17/23 1235 270 lb (122.5 kg)     Height 05/17/23 1235 5\' 8"  (1.727 m)     Head Circumference --      Peak Flow --      Pain Score 05/17/23 1235 6     Pain Loc --      Pain Education --      Exclude from Growth Chart --     Most recent vital signs: Vitals:   05/17/23 1238  BP: 124/73  Pulse: 71  Resp: 16  Temp: 98.9 F (37.2 C)  SpO2: 98%    General: Awake, no distress.  CV:  Good peripheral perfusion.  Resp:  Normal effort. Breath sounds clear. Abd:  No distention.  Other:     ED Results / Procedures / Treatments   Labs (all labs ordered are listed, but only abnormal results are displayed) Labs Reviewed - No data to display   EKG     RADIOLOGY  Image and radiology report reviewed and interpreted by me. Radiology report consistent with the same.    PROCEDURES:  Critical Care performed: No  Procedures   MEDICATIONS ORDERED IN ED:  Medications - No data to display   IMPRESSION / MDM / ASSESSMENT AND PLAN / ED COURSE   I have reviewed the triage  note.  Differential diagnosis includes, but is not limited to, Covid, viral syndrome  Patient's presentation is most consistent with acute, uncomplicated illness.  32 year old female presents with Covid symptoms after exposure.   Patient states that her employer needs a note to verify that she was evaluated and needs to be out of work. We also discussed performing a Covid test while in the ER. She declines and states that her home tests were positive. Plan will be to treat symptomatically and provide the work note she needs.      FINAL CLINICAL IMPRESSION(S) / ED DIAGNOSES   Final diagnoses:  COVID     Rx / DC Orders   ED Discharge Orders          Ordered    guaiFENesin-codeine (ROBITUSSIN AC) 100-10 MG/5ML syrup  3 times daily PRN        05/17/23 1356             Note:  This document was prepared using Dragon voice recognition software and  may include unintentional dictation errors.   Chinita Pester, FNP 05/19/23 1007    Corena Herter, MD 05/20/23 2031

## 2023-05-17 NOTE — ED Triage Notes (Signed)
Pt in via POV, states her 32 year old tested positive for Covid this past week, states she is now symptomatic x 2 days and would like to be tested/treated for Covid.  Ambulatory to triage; NAD noted at this time.

## 2023-06-03 ENCOUNTER — Other Ambulatory Visit: Payer: Self-pay

## 2023-06-03 ENCOUNTER — Emergency Department
Admission: EM | Admit: 2023-06-03 | Discharge: 2023-06-03 | Disposition: A | Discharge status: Home / Self Care | Payer: BC Managed Care – PPO | Attending: Emergency Medicine | Admitting: Emergency Medicine

## 2023-06-03 DIAGNOSIS — E11649 Type 2 diabetes mellitus with hypoglycemia without coma: Secondary | ICD-10-CM | POA: Diagnosis not present

## 2023-06-03 DIAGNOSIS — E162 Hypoglycemia, unspecified: Secondary | ICD-10-CM

## 2023-06-03 LAB — CBG MONITORING, ED
Glucose-Capillary: 89 mg/dL (ref 70–99)
Glucose-Capillary: 73 mg/dL (ref 70–99)

## 2023-06-03 NOTE — ED Triage Notes (Signed)
Pt presents to ED with c/o hypoglycemia, pt states her sugars have been in the 70's but states it has been fluctuating but does state it does increase with food or drink. Pt states bypass surgery in Dec of 2018.

## 2023-06-03 NOTE — ED Provider Notes (Signed)
[facility] Emergency Department Provider Note     Event Date/Time   First MD Initiated Contact with Patient 06/03/23 1915     (approximate)   History   Hypoglycemia   HPI  Crystal Strickland is a 32 y.o. female with a history of gestational diabetes presents to the ED with complaint of hypoglycemia. She reports her blood sugar levels have been in the 70's, fluctuating but notes increases with food or drink. Bypass surgery in 12/23 in [place]. Reports drinks beer daily, which she believes helps keeps her glucose levels up because she stopped drinking beer on Sunday when she noticed her levels dropping. Associated symptoms include dizziness, shakiness and feeling cold.     Physical Exam   Triage Vital Signs: ED Triage Vitals  Encounter Vitals Group     BP 06/03/23 1806 (!) 147/89     Systolic BP Percentile --      Diastolic BP Percentile --      Pulse Rate 06/03/23 1806 68     Resp 06/03/23 1806 17     Temp 06/03/23 1806 98.8 F (37.1 C)     Temp Source 06/03/23 1806 Oral     SpO2 06/03/23 1806 100 %     Weight 06/03/23 1858 269 lb 13.5 oz (122.4 kg)     Height 06/03/23 1858 5\' 8" (1.727 m)     Head Circumference --      Peak Flow --      Pain Score 06/03/23 1806 0     Pain Loc --      Pain Education --      Exclude from Growth Chart --     Most recent vital signs: Vitals:   06/03/23 1806  BP: (!) 147/89  Pulse: 68  Resp: 17  Temp: 98.8 F (37.1 C)  SpO2: 100%    General Awake, no distress.  HEENT NCAT. PERRL. EOMI. No rhinorrhea.  CV:  Good peripheral perfusion. RRR RESP:  Normal effort. LCTAB ABD:  No distention.  NEURO:  No focal deficits.   ED Results / Procedures / Treatments   Labs (all labs ordered are listed, but only abnormal results are displayed) Labs Reviewed  CBG MONITORING, ED  CBG MONITORING, ED   No results found.  PROCEDURES:  Critical Care performed: No  Procedures   MEDICATIONS ORDERED IN  ED: Medications - No data to display   IMPRESSION / MDM / ASSESSMENT AND PLAN / ED COURSE  I reviewed the triage vital signs and the nursing notes.                               32  y.o. female presents to the emergency department for evaluation and treatment of hypoglycemia. See HPI for further details.   Differential diagnosis includes, but is not limited to hypoglycemia vs alcohol withdrawal vs gastric bypass complication.    Patient's presentation is most consistent with acute complicated illness / injury requiring diagnostic workup.  Patient presentation is likely due to a combination of post-bariatric surgery changes and alcohol use. Given the patients history of gestational diabetes also contributes to her risk of hypoglycemic episodes. Her blood glucose levels in the ED are borderline low, though responsive to dietary intake which is reassuring. Patient is encouraged to monitor her glucose levels at home and to eat three meals a day with frequent snacking in between. Patient is given a list of primary  care providers for further management. Encouraged close follow up. Patient is given ED precautions to return to the ED for any worsening or new symptoms. Patient verbalizes understanding. All questions and concerns were addressed during ED visit.     FINAL CLINICAL IMPRESSION(S) / ED DIAGNOSES   Final diagnoses:  Hypoglycemia, unspecified    Rx / DC Orders   ED Discharge Orders     None        Note:  This document was prepared using Dragon voice recognition software and may include unintentional dictation errors.    Romeo Apple, Shadell Brenn A, PA-C 06/04/23 0230    Merwyn Katos, MD 06/06/23 [phone]

## 2023-06-03 NOTE — Discharge Instructions (Addendum)
Go to primary car for A1C check. Eat three times a day.   Please go to the following website to schedule new (and existing) patient appointments:   [email or web address]   The following is a list of primary care offices in the area who are accepting new patients at this time.  Please reach out to one of them directly and let them know you would like to schedule an appointment to follow up on an Emergency Department visit, and/or to establish a new primary care provider (PCP).  There are likely other primary care clinics in the are who are accepting new patients, but this is an excellent place to start:  [facility] Lead physician: Dr Shirlee Latch [address] [place], [place] [address] [phone]  [facility] Lead Physician: Dr Alba Cory [address], [place], [place] [address] [phone]  [facility]  Lead Physician: Dr Olevia Perches [address] [place], [place] [address] [phone]  [facility] Lead Physician: Dr Sofie Hartigan [address] [place], [place] [address] [phone]  [facility] Lead Physician: Dr Bari Edward [address] [place], [place], [place] [address] [phone]

## 2024-05-07 ENCOUNTER — Other Ambulatory Visit: Payer: Self-pay

## 2024-05-07 ENCOUNTER — Emergency Department
Admission: EM | Admit: 2024-05-07 | Discharge: 2024-05-07 | Disposition: A | Discharge status: Home / Self Care | Payer: Self-pay | Attending: Emergency Medicine | Admitting: Emergency Medicine

## 2024-05-07 DIAGNOSIS — T5491XA Toxic effect of unspecified corrosive substance, accidental (unintentional), initial encounter: Secondary | ICD-10-CM | POA: Insufficient documentation

## 2024-05-07 DIAGNOSIS — H11432 Conjunctival hyperemia, left eye: Secondary | ICD-10-CM | POA: Insufficient documentation

## 2024-05-07 DIAGNOSIS — H10212 Acute toxic conjunctivitis, left eye: Secondary | ICD-10-CM | POA: Insufficient documentation

## 2024-05-07 MED ORDER — FLUORESCEIN SODIUM 1 MG OP STRP
ORAL_STRIP | OPHTHALMIC | Status: AC
  Filled 2024-05-07: qty 1

## 2024-05-07 MED ORDER — ERYTHROMYCIN 5 MG/GM OP OINT
TOPICAL_OINTMENT | Freq: Once | OPHTHALMIC | Status: AC
  Administered 2024-05-07: 1 via OPHTHALMIC
  Filled 2024-05-07: qty 1

## 2024-05-07 MED ORDER — TETRACAINE HCL 0.5 % OP SOLN
2.0000 [drp] | Freq: Once | OPHTHALMIC | Status: AC
  Administered 2024-05-07: 2 [drp] via OPHTHALMIC
  Filled 2024-05-07: qty 4

## 2024-05-07 MED ORDER — SODIUM CHLORIDE 0.9 % IR SOLN
1000.0000 mL | Freq: Once | Status: AC
  Administered 2024-05-07: 1000 mL

## 2024-05-07 MED ORDER — ERYTHROMYCIN 5 MG/GM OP OINT: 1.0000 | TOPICAL_OINTMENT | Freq: Four times a day (QID) | OPHTHALMIC | 0 refills | Status: AC

## 2024-05-07 MED ORDER — ERYTHROMYCIN 5 MG/GM OP OINT: TOPICAL_OINTMENT | Freq: Four times a day (QID) | OPHTHALMIC | Status: DC

## 2024-05-07 MED ORDER — TETRACAINE HCL 0.5 % OP SOLN
2.0000 [drp] | Freq: Once | OPHTHALMIC | Status: AC
  Administered 2024-05-07: 2 [drp] via OPHTHALMIC

## 2024-05-07 NOTE — ED Provider Notes (Signed)
  EMERGENCY DEPARTMENT AT [facility] Provider Note   CSN: [id number] Arrival date & time: 05/07/24  1634     Patient presents with: Eye Problem   Crystal Strickland is a 33 y.o. female presents to the emergency department for evaluation of left eye irritation.  Around 1 PM today she splashed bleach into her left eye.  She has had some mild redness, drainage.  No severe pain.  No foreign body sensation.  She states her vision is normal just slightly blurred due to the drainage.  Patient did flush her eye with water for 20 minutes immediately following the contact with bleach      Prior to Admission medications   Medication Sig Start Date End Date Taking? Authorizing Provider  erythromycin  ophthalmic ointment Place 1 Application into the left eye 4 (four) times daily. 05/07/24  Yes Charlene Debby BROCKS, PA-C  ondansetron  (ZOFRAN  ODT) 4 MG disintegrating tablet Take 1 tablet (4 mg total) by mouth every 6 (six) hours as needed for nausea. Patient not taking: Reported on 10/17/2015 09/28/15   Defrancesco, Gladis LABOR, MD  oxyCODONE  (ROXICODONE ) 5 MG immediate release tablet Take 1 tablet (5 mg total) by mouth every 6 (six) hours as needed for breakthrough pain. Do not drive while taking this medication. Patient not taking: Reported on 10/17/2015 09/24/15   Sharman Sena LABOR, MD    Allergies: Patient has no known allergies.    Review of Systems  Updated Vital Signs BP 119/77 (BP Location: Left Arm)   Pulse 65   Temp 98.9 F (37.2 C) (Oral)   Resp 19   Ht 5' 7 (1.702 m)   Wt 112.5 kg   SpO2 100%   BMI 38.84 kg/m   Physical Exam Constitutional:      Appearance: She is well-developed.  HENT:     Head: Normocephalic and atraumatic.  Eyes:     Extraocular Movements: Extraocular movements intact.     Conjunctiva/sclera: Conjunctivae normal.     Pupils: Pupils are equal, round, and reactive to light.     Comments: Mild left eye conjunctival erythema.  pH 8.  After 2 L of  normal saline pH down to 7.  Patient with normal pupillary response bilaterally.  Normal vision.  Cornea appears to be clear and not hazy.  No fluorescein  stain uptake or signs ulceration.  Cardiovascular:     Rate and Rhythm: Normal rate.  Pulmonary:     Effort: Pulmonary effort is normal. No respiratory distress.  Musculoskeletal:        General: Normal range of motion.     Cervical back: Normal range of motion.  Skin:    General: Skin is warm.     Findings: No rash.  Neurological:     General: No focal deficit present.     Mental Status: She is alert and oriented to person, place, and time.  Psychiatric:        Behavior: Behavior normal.        Thought Content: Thought content normal.     (all labs ordered are listed, but only abnormal results are displayed) Labs Reviewed - No data to display  EKG: None  Radiology: No results found.   Procedures   Medications Ordered in the ED  tetracaine  (PONTOCAINE) 0.5 % ophthalmic solution 2 drop (has no administration in time range)  fluorescein  1 MG ophthalmic strip (has no administration in time range)  erythromycin  ophthalmic ointment (has no administration in time range)  tetracaine  (PONTOCAINE) 0.5 %  ophthalmic solution 2 drop (2 drops Left Eye Given 05/07/24 1858)  sodium chloride  irrigation 0.9 % 1,000 mL (1,000 mLs Irrigation Given 05/07/24 1958)                                    Medical Decision Making Risk Prescription drug management.   33 year old female with chemical conjunctivitis of the left eye with bleach.  Reviewed poison control recommendations.  She was given 2 L of normal saline with Joesph lens and brought the pH down to normal.  She is placed on erythromycin  ointment 4 times daily.  Fluorescein  stain showed no signs of corneal abrasion or ulceration.  No haziness to the cornea.  She appears well and redness has significantly resolved following fluid administration.  She is given strict return precautions  and will follow-up with ophthalmology. Final diagnoses:  Chemical conjunctivitis, left    ED Discharge Orders          Ordered    erythromycin  ophthalmic ointment  4 times daily        05/07/24 2059               Charlene Debby JAYSON DEVONNA 05/07/24 2102    Claudene Rover, MD 05/08/24 1558

## 2024-05-07 NOTE — ED Triage Notes (Signed)
 First nurse note: Pt contacted poison control PTA due to getting bleach in her eyes, PC recommends checking PH of eye, irrigation and visual acuity.

## 2024-05-07 NOTE — Discharge Instructions (Signed)
 Please continue with topical antibiotics 4 times daily for the next week.  Return to the ER for any increasing pain swelling warmth redness or drainage or vision changes.  Call ophthalmology office on Monday to schedule a follow-up appointment.

## 2024-05-07 NOTE — ED Notes (Signed)
 Irrigation bag of NS complete, morgan lens removed, NAD
